# Patient Record
Sex: Female | Born: 1985 | Race: White | Hispanic: No | Marital: Married | Smoking: Current every day smoker
Health system: Southern US, Community
[De-identification: ages and names within clinical notes are randomized; demographics above are authoritative.]

## PROBLEM LIST (undated history)

## (undated) DIAGNOSIS — S53106A Unspecified dislocation of unspecified ulnohumeral joint, initial encounter: Secondary | ICD-10-CM

---
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
## 2005-05-20 ENCOUNTER — Emergency Department (HOSPITAL_COMMUNITY): Admission: EM | Admit: 2005-05-20 | Discharge: 2005-05-20 | Payer: Self-pay | Admitting: Emergency Medicine

## 2005-10-15 ENCOUNTER — Emergency Department (HOSPITAL_COMMUNITY): Admission: EM | Admit: 2005-10-15 | Discharge: 2005-10-15 | Payer: Self-pay | Admitting: Family Medicine

## 2006-02-02 ENCOUNTER — Inpatient Hospital Stay (HOSPITAL_COMMUNITY): Admission: EM | Admit: 2006-02-02 | Discharge: 2006-02-06 | Payer: Self-pay | Admitting: Emergency Medicine

## 2006-02-02 ENCOUNTER — Ambulatory Visit: Payer: Self-pay | Admitting: Internal Medicine

## 2008-07-08 ENCOUNTER — Emergency Department (HOSPITAL_BASED_OUTPATIENT_CLINIC_OR_DEPARTMENT_OTHER): Admission: EM | Admit: 2008-07-08 | Discharge: 2008-07-09 | Payer: Self-pay | Admitting: Emergency Medicine

## 2008-07-09 ENCOUNTER — Ambulatory Visit: Payer: Self-pay | Admitting: Diagnostic Radiology

## 2008-07-22 ENCOUNTER — Emergency Department (HOSPITAL_BASED_OUTPATIENT_CLINIC_OR_DEPARTMENT_OTHER): Admission: EM | Admit: 2008-07-22 | Discharge: 2008-07-22 | Payer: Self-pay | Admitting: Emergency Medicine

## 2009-04-30 ENCOUNTER — Emergency Department (HOSPITAL_COMMUNITY): Admission: EM | Admit: 2009-04-30 | Discharge: 2009-04-30 | Payer: Self-pay | Admitting: Emergency Medicine

## 2009-08-04 ENCOUNTER — Emergency Department (HOSPITAL_COMMUNITY): Admission: EM | Admit: 2009-08-04 | Discharge: 2009-08-04 | Payer: Self-pay | Admitting: Emergency Medicine

## 2010-03-26 ENCOUNTER — Encounter: Payer: Self-pay | Admitting: Orthopedic Surgery

## 2010-07-21 NOTE — Procedures (Signed)
EEG NUMBER:  [id number]   This 25 year old female was found unresponsive.  She was brought to the  emergency room by EMS and she had taken several Valiums.  The patient  was started on Rocephin.   TECHNICAL DESCRIPTION:  This EEG was recorded in an obtunded patient.  The background activity shows well-formed alpha but also shows frontal  intermittent delta activity which is more prominent in the left than the  right.  No definite epileptiform activity is seen with this.  Stage II  sleep was also noted during this EEG.   Photic stimulation did not produce a drive response.  Hyperventilation  testing produced no significant abnormalities.   IMPRESSION:  Markedly abnormal EEG showing frontal intermittent delta  slowing, more prominent in the left than the right.  No definite  epileptiform activity is seen on this study.  Findings above can be seen  with multiple etiologies and by themselves are nonspecific, toxic  metabolic disorders, postictal state, medication effect, etc. should be  considered in the differential diagnosis.           ______________________________  Genene Churn. Sandria Manly, M.D.     YNW:GNFA  D:  02/04/2006 14:21:40  T:  02/05/2006 10:02:30  Job #:  [id number]   cc:   Madaline Guthrie, M.D.  Fax: [phone]

## 2010-07-21 NOTE — Discharge Summary (Signed)
Marie Mckay, Marie Mckay              ACCOUNT NO.:  [id number]   MEDICAL RECORD NO.:  [id number]          PATIENT TYPE:  INP   LOCATION:  4731                         FACILITY:  [facility]   PHYSICIAN:  Peggye Pitt, M.D. DATE OF BIRTH:  1985/03/26   DATE OF ADMISSION:  02/02/2006  DATE OF DISCHARGE:  02/06/2006                               DISCHARGE SUMMARY   DISCHARGE DIAGNOSES:  1. Ventilatory-dependent respiratory failure.  2. Overdose with opiates and barbiturates and benzodiazepine,      resolved.   DISCHARGE MEDICATIONS:  1. Cymbalta 20 mg p.o. b.i.d.  2. Trazodone 25 mg p.o. q.h.s.   DISPOSITION AND FOLLOWUP:  The patient is to follow up with her primary  care physician, Dr. Lenise Arena at [facility].  The date of the  appointment has been set for Tuesday, December 11th at 10 a.m.  The case  manager is also to arrange followup with outpatient psychiatry.  The  patient will be called with the date and time with this appointment.   IMAGING STUDIES PERFORMED DURING THIS ADMISSION:  The patient had a CT  of the head without contrast that showed no acute intracranial  abnormality with some sphenoid sinusitis with mucosal thickening of the  ethmoid air cells.  The patient had a portable chest x-ray that showed a  right upper lobe and right perihilar opacity, most consistent with  aspiration pneumonia.  The patient also had an MRI of the brain with and  without contrast that was within normal limits, showed no acute stroke,  abnormal enhancement, or inflammatory process, and again saw the acute  sphenoid sinusitis.   BRIEF HISTORY AND PHYSICAL EXAMINATION:  For full details, please see  the chart, but in brief, Marie Mckay is a 25 year old Caucasian female  who was brought in by EMS.  Our initial report was that she had had some  Valium tablets the night prior to admission, and the morning of  admission she fell off her couch and began seizing.  A friend called the  EMS who gave Valium x1 which stopped the seizures.  She was intubated  upon arrival to the emergency room secondary to an inability to maintain  her airway and pneumonia, possibly causing aspiration.  The patient was  unable to give any history upon admission.   On admission:  VITAL SIGNS:  Her temperature was 99.3, her blood pressure was 132/76  dropping to 75/35 immediately after intubation.  Her heart rate was 130.  She was sating 99% and 100% Fi02.   LABORATORY UPON ADMISSION:  Her sodium was 135, potassium 4.6, chloride  102, bicarb 27, BUN 13, creatinine 1.0 with a glucose of 139.  Her  bilirubin was 0.7, alkaline phosphatase 80, AST 104, ALT 73, protein  6.9, albumin 3.5.  Her calcium was 8.3.  Her white blood cell count was  22.8 with an ANC of 21.2.  Her hemoglobin was 14.6 with an MCV of 96,  and her platelets were at 329.  UA was only positive for glucose of 100.  Her urine pregnancy test was negative.  Her alcohol level was  less than  5.  Tylenol and salicylate levels were within normal limits.  Her UDS  was positive for marijuana, barbiturates, and benzodiazepine.  Her  initial ABG and 100% FiO2 delivered via ventilator showed a pH of 7.27,  a CO2 of 56, an O2 of 299, and a bicarb of 26.   HOSPITAL COURSE:  Altered mental status and seizures secondary to  overdose.  The patient needed to be intubated upon arrival secondary to  inability to maintain her airway.  During intubation, she had  significant amount of vomiting, which possibly precipitated an episode  of aspiration pneumonitis.  Immediately after intubation, her blood  pressure dropped, which initiated a workup for sepsis; however, this was  finally believed to be secondary to the medications that she received  for intubation.  She was placed on dopamine drip for a brief time, but  her blood pressure responded nicely to fluid boluses.  Post admission,  the patient passed a spontaneous breathing trial, which enabled  [place] to  extubate her.  She did quite well throughout the rest of her admission.  Her initial white count then dropped.  This was thought to be secondary  to the seizures.  CT and MRI of the head were all within normal limits.  Dr. Jeanie Sewer with psychiatry has seen the patient and has recommended  outpatient psychiatry followup as well as initiation of Cymbalta and  Trazodone.  The patient has been extensively counseled.  She states that  she did not intend to kill herself and that this was indeed a mistake of  her taking some of her friend's opiates mixed with alcohol.   The patient is discharged in stable condition to home.  She will follow  up with her primary care physician as well as with psychiatry.   Upon discharge, vital signs were temperature 97.4, heart rate 61,  respiratory rate 20, blood pressure 107/67.  O2 sats are 95% on room  air.   LABORATORY UPON DISCHARGE:  Show a hemoglobin of 10.5, sodium 142,  potassium 3.5, chloride 108, bicarb 23, BUN 3, creatinine 0.6, a glucose  of 83, and a calcium of 8.6.      Peggye Pitt, M.D.  Electronically Signed     EH/MEDQ  D:  02/06/2006  T:  02/06/2006  Job:  [id number]   cc:   Marie Mckay, M.D.

## 2010-07-21 NOTE — Consult Note (Signed)
Marie Mckay, Marie Mckay              ACCOUNT NO.:  [id number]   MEDICAL RECORD NO.:  [id number]            PATIENT TYPE:   LOCATION:                                 FACILITY:   PHYSICIAN:  Antonietta Breach, M.D.       DATE OF BIRTH:   DATE OF CONSULTATION:  02/05/2006  DATE OF DISCHARGE:                                 CONSULTATION   REASON FOR CONSULTATION:  Overdose, substance abuse, depression.   HISTORY OF PRESENT ILLNESS:  [name] is a 25 year old female  admitted to the [facility] health system on the 1st of December 2007  after an overdose with Valium, narcotics, and wine.   The patient openly states that she took a narcotic for pain, drank a  glass of wine, and took some friend's Valium. She had not been sleeping  and just wanted relief. She repeatedly states that she had no intention  of suicide. She describes in open-ended discussions with the undersigned  and staff interest in the future, constructive goals, and no suicidal  ideation. She has no thoughts of harming others, no delusions, no  hallucinations.   She does acknowledge a number of stresses including a breakup with a  boyfriend two weeks ago and having to find a new place to stay due to  her grandmother moving to a condominium. However, she also mentions a  number of friends that she continues to socialize with and have fun  with. Her orientation is intact. Her memory is intact.   PAST PSYCHIATRIC HISTORY:  The patient has suffered great loss. Both her  parents passed away by the time she was age 41, and her sister passed  away in a car wreck with her father. She developed depression eventually  and was placed on Zoloft by her outpatient psychiatrist. This took place  in her late teens.   The symptoms of depressed mood, decreased energy, poor concentration,  anhedonia, and insomnia resolved after several weeks of experiencing  them. She also was placed on Xanax for feeling on edge, muscle tension,  worry, and insomnia.   The patient also underwent psychotherapy to help her get through the  grief and loss. Eventually she came off of the Zoloft. However, she  describes several periods of depression. She has no history of mania,  delusions, hallucinations, psychiatric admissions or suicide attempts.   FAMILY PSYCHIATRIC HISTORY:  Unknown.   SOCIAL HISTORY:  The patient is single. She has never been married. She  has no children. She went to live with her grandmother after her parents  passed away. She has an education through the eleventh grade. She pulled  out of high school and went to work. [place]. She  states she does marijuana fairly often. She denies any other illegal  drugs. She denies drinking alcohol regularly and points out that she  just turned 20. She is not having any alcohol or benzodiazepine or  barbiturate withdrawal symptoms.   GENERAL MEDICAL PROBLEMS:  The patient  reported to the emergency room on  the 13th of August 2007. Had a knee injury at that time that led to  subsequent prescription of a narcotic. She is status post intubation  after the overdose of hydrocodone, Valium, and wine.   MEDICATIONS:  MAR is reviewed. The patient is not currently on any  psychotropics.   ALLERGIES:  She has no known drug allergies.   LABORATORY DATA:  White blood cell count 10.4, hemoglobin 10.5, platelet  count 245. INR 1.1. BUN 3, creatinine 0.6. SGOT 25, SGPT 43. Albumin  2.4. Calcium 8.6. Urine HCG negative. Tylenol level negative. Aspirin  negative. Urine drug screen positive for barbiturates, positive for  benzodiazepines, positive for THC. Alcohol negative.   On the 2nd of December the patient underwent an MRI of the brain with  and without contrast. This image was within normal limits without any  acute process. There was a node of acute sphenoid sinusitis.   REVIEW OF  SYSTEMS:  CONSTITUTIONAL:  Afebrile. HEENT:  No trauma. Eyes:  No visual changes. Ears:  No hearing impairment. Nose:  No rhinorrhea.  Mouth/throat:  No sore throat. NEUROLOGIC:  Unremarkable. PSYCHIATRIC:  As above. The patient does describe decreased energy, insomnia, some  decreased concentration as well as irritability over a number of weeks.  The patient was also treated with Cymbalta after the Zoloft and Cymbalta  were most effective and successful in relieving her depressive symptoms.  She is very much interested in restarting that. Also the insomnia was  treated successfully with trazodone. CARDIOVASCULAR:  No chest pain,  palpitations or edema. RESPIRATORY:  No coughing or wheezing.  GASTROINTESTINAL:  No nausea, vomiting, diarrhea. GENITOURINARY:  No  dysuria. SKIN:  Unremarkable. ENDOCRINE/METABOLIC:  Unremarkable.  HEMATOLOGIC/LYMPHATIC:  Slight anemia.  MUSCULOSKELETAL:  The patient bruised her knee back in August.   PHYSICAL EXAMINATION:  VITAL SIGNS:  Temperature 97.5, pulse 75,  respirations 18, blood pressure 105/76, O2 saturation on room air 97%.   MENTAL STATUS EXAM:  [name] is an alert female sitting up in her  hospital bed, well-groomed, and appearing her stated age. The patient is  oriented to all spheres. Her memory is intact to immediate recent remote  except for the blackout overdose. Her speech involves normal rate and  prosody. Her fund of knowledge and intelligence are within normal  limits. Her concentration is within normal limits. Her affect is mildly  constricted at baseline with a broad appropriate response. Her mood is  mildly anxious. Thought process is logical, coherent, goal-directed, no  looseness of association or thought content. No thoughts of harming  herself. No thoughts of harming others. No delusions, no hallucinations.  Her insight is partial in that she recognizes her depression and anxiety symptoms. However, she does not recognize her  substance abuse pattern.  Her judgment is intact for normal social function and self-care.   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 296.32, major  depressive disorder. Rule out polysubstance dependence.   AXIS II:  Deferred.   AXIS III:  See general medical problems.   AXIS IV:  Primary support group.   AXIS V:  1.   Ms. Keefe is not at risk to harm herself or others. She agrees to use  emergency services immediately for any thoughts of harming herself,  thoughts of harming others or distress.   Given the patient's combination of moderate depression, anxiety, and  abuse of substances, the undersigned recommended inpatient psychiatric  dual diagnoses treatment. However, the patient declined and she is not  committable. She does agree to outpatient psychiatric treatment, but she  also declines any substance abuse relapse prevention programs.   Informed consent, the indications, alternatives, and adverse effects of  Cymbalta and trazodone were discussed with the patient. She understands  the above information and would like to restart Cymbalta for  antidepression and trazodone for anti insomnia and augmenting Cymbalta.  She understands potential complications of making the doses of these two  agents too high. The Cymbalta having serotin reuptake inhibition as part  of its profile can help with anxiety as can trazodone.   RECOMMENDATIONS:  1. Would restart Cymbalta at the low starting dose of 20 mg p.o.      b.i.d. at 8 a.m. and 4 p.m.  2. Would start trazodone at 25 mg q.h.s. with 25 mg p.r.n., then would      adjust trazodone based upon p.r.n. needed the night before by 25 to      50 mg per day to an estimated effective dose of 100 mg q.h.s. The      patient will not drive if drowsy.  3. Recommendation for followup. The case manager can call one of the      psychiatric clinics attached to [facility], [facility] or        [facility] to set this patient up with pharmacotherapy      followup and psychotherapy. The psychotherapy could involve      cognitive behavioral therapy, deep breathing, and progressive      muscle relaxation. Also as the therapeutic alliance develops and      insight into any remaining substance abuse problems develops, the      patient's engagement in chemical abuse prevention could be      facilitated.  4. Ego supportive psychotherapy and education were provided to      patient.      Antonietta Breach, M.D.  Electronically Signed     JW/MEDQ  D:  02/06/2006  T:  02/06/2006  Job:  [id number]   cc:   Madaline Guthrie, M.D.

## 2013-06-18 ENCOUNTER — Encounter (HOSPITAL_COMMUNITY): Payer: Self-pay | Admitting: Emergency Medicine

## 2013-06-18 ENCOUNTER — Emergency Department (HOSPITAL_COMMUNITY)
Admission: EM | Admit: 2013-06-18 | Discharge: 2013-06-18 | Disposition: A | Discharge status: Home / Self Care | Payer: Self-pay | Attending: Emergency Medicine | Admitting: Emergency Medicine

## 2013-06-18 ENCOUNTER — Emergency Department (HOSPITAL_COMMUNITY): Payer: Self-pay

## 2013-06-18 DIAGNOSIS — Y93E1 Activity, personal bathing and showering: Secondary | ICD-10-CM | POA: Insufficient documentation

## 2013-06-18 DIAGNOSIS — F17200 Nicotine dependence, unspecified, uncomplicated: Secondary | ICD-10-CM | POA: Insufficient documentation

## 2013-06-18 DIAGNOSIS — R000 Tachycardia, unspecified: Secondary | ICD-10-CM | POA: Insufficient documentation

## 2013-06-18 DIAGNOSIS — Y929 Unspecified place or not applicable: Secondary | ICD-10-CM | POA: Insufficient documentation

## 2013-06-18 DIAGNOSIS — S32010A Wedge compression fracture of first lumbar vertebra, initial encounter for closed fracture: Secondary | ICD-10-CM

## 2013-06-18 DIAGNOSIS — W1809XA Striking against other object with subsequent fall, initial encounter: Secondary | ICD-10-CM | POA: Insufficient documentation

## 2013-06-18 DIAGNOSIS — S32009A Unspecified fracture of unspecified lumbar vertebra, initial encounter for closed fracture: Secondary | ICD-10-CM | POA: Insufficient documentation

## 2013-06-18 MED ORDER — METHOCARBAMOL 500 MG PO TABS: 500.0000 mg | ORAL_TABLET | Freq: Two times a day (BID) | ORAL | Status: DC

## 2013-06-18 MED ORDER — DIAZEPAM 5 MG PO TABS
10.0000 mg | ORAL_TABLET | Freq: Once | ORAL | Status: AC
  Administered 2013-06-18: 10 mg via ORAL
  Filled 2013-06-18: qty 2

## 2013-06-18 MED ORDER — IBUPROFEN 800 MG PO TABS
800.0000 mg | ORAL_TABLET | Freq: Once | ORAL | Status: AC
  Administered 2013-06-18: 800 mg via ORAL
  Filled 2013-06-18: qty 1

## 2013-06-18 MED ORDER — OXYCODONE-ACETAMINOPHEN 5-325 MG PO TABS: 1.0000 | ORAL_TABLET | Freq: Four times a day (QID) | ORAL | Status: DC | PRN

## 2013-06-18 MED ORDER — OXYCODONE-ACETAMINOPHEN 5-325 MG PO TABS
2.0000 | ORAL_TABLET | Freq: Once | ORAL | Status: AC
  Administered 2013-06-18: 2 via ORAL
  Filled 2013-06-18: qty 2

## 2013-06-18 NOTE — Discharge Instructions (Signed)
1. Medications: robaxin, percocet, ibuprofen 800mg  three times per day with food, usual home medications 2. Treatment: rest, drink plenty of fluids, gentle stretching as discussed, alternate ice and heat 15 min on and 15 min off 3. Follow Up: [facility] of your diagnoses and further evaluation after today's visit within 1 week;  if you do not have a primary care doctor use the resource guide provided to find one;    Back, Compression Fracture A compression fracture happens when a force is put upon the length of your spine. Slipping and falling on your bottom are examples of such a force. When this happens, sometimes the force is great enough to compress the building blocks (vertebral bodies) of your spine. Although this causes a lot of pain, this can usually be treated at home, unless your caregiver feels hospitalization is needed for pain control. Your backbone (spinal column) is made up of 24 main vertebral bodies in addition to the sacrum and coccyx (see illustration). These are held together by tough fibrous tissues (ligaments) and by support of your muscles. Nerve roots pass through the openings between the vertebrae. A sudden wrenching move, injury, or a fall may cause a compression fracture of one of the vertebral bodies. This may result in back pain or spread of pain into the belly (abdomen), the buttocks, and down the leg into the foot. Pain may also be created by muscle spasm alone. Large studies have been undertaken to determine the best possible course of action to help your back following injury and also to prevent future problems. The recommendations are as follows. FOLLOWING A COMPRESSION FRACTURE: Do the following only if advised by your caregiver.   If a back brace has been suggested or provided, wear it as directed.  DO NOT stop wearing the back brace unless instructed by your caregiver.  When allowed to return to regular  activities, avoid a sedentary life style. Actively exercise. Sporadic weekend binges of tennis, racquetball, water skiing, may actually aggravate or create problems, especially if you are not in condition for that activity.  Avoid sports requiring sudden body movements until you are in condition for them. Swimming and walking are safer activities.  Maintain good posture.  Avoid obesity.  If not already done, you should have a DEXA scan. Based on the results, be treated for osteoporosis. FOLLOWING ACUTE (SUDDEN) INJURY:  Only take over-the-counter or prescription medicines for pain, discomfort, or fever as directed by your caregiver.  Use bed rest for only the most extreme acute episode. Prolonged bed rest may aggravate your condition. Ice used for acute conditions is effective. Use a large plastic bag filled with ice. Wrap it in a towel. This also provides excellent pain relief. This may be continuous. Or use it for 30 minutes every 2 hours during acute phase, then as needed. Heat for 30 minutes prior to activities is helpful.  As soon as the acute phase (the time when your back is too painful for you to do normal activities) is over, it is important to resume normal activities and work Arboriculturist. Back injuries can cause potentially marked changes in lifestyle. So it is important to attack these problems aggressively.  See your caregiver for continued problems. He or she can help or refer you for appropriate exercises, physical therapy and work hardening if needed.  If you are given narcotic medications for your condition, for the next 24 hours DO NOT:  Drive  Operate machinery  or power tools.  Sign legal documents.  DO NOT drink alcohol, take sleeping pills or other medications that may interfere with treatment. If your caregiver has given you a follow-up appointment, it is very important to keep that appointment. Not keeping the appointment could result in a chronic or  permanent injury, pain, and disability. If there is any problem keeping the appointment, you must call back to this facility for assistance.  SEEK IMMEDIATE MEDICAL CARE IF:  You develop numbness, tingling, weakness, or problems with the use of your arms or legs.  You develop severe back pain not relieved with medications.  You have changes in bowel or bladder control.  You have increasing pain in any areas of the body. Document Released: 02/19/2005 Document Revised: 05/14/2011 Document Reviewed: 09/24/2007 [facility] Patient Information 2014 [place], [place].    Emergency Department Resource Guide 1) Find a Doctor and Pay Out of Pocket Although you won't have to find out who is covered by your insurance plan, it is a good idea to ask around and get recommendations. You will then need to call the office and see if the doctor you have chosen will accept you as a new patient and what types of options they offer for patients who are self-pay. Some doctors offer discounts or will set up payment plans for their patients who do not have insurance, but you will need to ask so you aren't surprised when you get to your appointment.  2) Contact Your Local Health Department Not all health departments have doctors that can see patients for sick visits, but many do, so it is worth a call to see if yours does. If you don't know where your local health department is, you can check in your phone book. The CDC also has a tool to help you locate your state's health department, and many state websites also have listings of all of their local health departments.  3) Find a Walk-in Clinic If your illness is not likely to be very severe or complicated, you may want to try a walk in clinic. These are popping up all over the country in pharmacies, drugstores, and shopping centers. They're usually staffed by nurse practitioners or physician assistants that have been trained to treat common illnesses and complaints.  They're usually fairly quick and inexpensive. However, if you have serious medical issues or chronic medical problems, these are probably not your best option.  No Primary Care Doctor: - Call Health Connect at  [phone] - they can help you locate a primary care doctor that  accepts your insurance, provides certain services, etc. - Physician Referral Service- [phone]  Chronic Pain Problems: Organization         Address  Phone   Notes  [facility]  [phone] Patients need to be referred by their primary care doctor.   Medication Assistance: Organization         Address  Phone   Notes  [facility] Medication [facility] [address] [place], [place] [address] [phone] --Must be a resident of [place] [facility] [place] -- Must have NO insurance coverage whatsoever (no Medicaid/ Medicare, etc.) -- The pt. MUST have a primary care doctor that directs their care regularly and follows them in the community   MedAssist  [phone]   [employer]  [phone]    Agencies that provide inexpensive medical care: Organization         Address  Phone   Notes  [facility]  [phone]   [facility]    [phone]   [facility] [place], [place] [address] [phone]   [place] [address], [place] [phone]   Planned Parenthood    [phone]   [facility]    [phone]   [place] and [place] [address], [place] Phone:  [phone], Fax:  [phone] Hours of Operation:  9 am - 6 pm, M-F.  Also accepts Medicaid/Medicare and self-pay.  [facility] for [place], [address], [place] Phone: [phone], Fax: [phone]. Hours of Operation:  8:30 am - 5:30 pm, M-F.  Also accepts Medicaid and self-pay.  [facility] [place] [address], [place]  Phone: [phone]   [place], [place], [place] [phone], Ext. [phone] Mondays & Thursdays: 7-9 AM.  First 15 patients are seen on a first come, first serve basis.    [place] Providers:  Organization         Address  Phone   Notes  [facility] [address], [place] [phone] Also accepts self-pay patients.  [facility] [id number] [place], [place]  [phone]   [place], [address], [place] [phone]   [facility] [address] [place] [phone]   Lucianne Lei [address], [place]   [phone] Only accepts [place] Access [place] patients after they have their name applied to their card.   Self-Pay (no insurance) in [facility]:  Organization         Address  Phone   Notes  Sickle Cell Patients, [facility] Internal Medicine [place] [phone]   [facility] [place] [phone]   [facility] [place]  [place], [place], [place] [phone]   [facility]/Dr. Osei-Bonsu  [address] [place] or [address], [place] [phone] Phone number for both [place] and [place] locations is the same.  [facility] [address] [place] [phone]   [facility] [address] [place] or [address] [phone]   [facility] [address], [place] [phone], phone; [phone], fax Sees patients 1st and 3rd Saturday of every month.  Must not qualify for public or private insurance (i.e. Medicaid, Medicare, [place] Health Choice, Veterans' Benefits)  Household income should be no more than 200% of the poverty level The clinic cannot  treat you if you are pregnant or think you are pregnant  Sexually transmitted diseases are not treated at the clinic.    Dental Care: Organization         Address  Phone  Notes  [facility] Department of [facility] [place] [phone] Accepts children up to age 59 who are enrolled in [place] or [place]; pregnant women with a Medicaid card; and children who have applied for Medicaid or [place] Health Choice, but were declined, whose parents can pay a reduced fee at time of service.  [facility] Department of  [facility]  [address], [place] [phone] Accepts children up to age 19 who are enrolled in Medicaid or [place]; pregnant women with a Medicaid card; and children who have applied for Medicaid or [place] Health Choice, but were declined, whose parents can pay a reduced fee at time of service.  [place] Adult Dental Access PROGRAM  [place] [phone] Patients are seen by appointment only. Walk-ins are not accepted. [place] will see patients 4 years of age and older. Monday - Tuesday (8am-5pm) Most Wednesdays (8:30-5pm) $30 per visit, cash only  [facility] Adult Dental Access PROGRAM  [address], [facility] [phone] Patients are seen by appointment only. Walk-ins are not accepted. [place] will see patients 40 years of age and older. One Wednesday Evening (Monthly: Volunteer Based).  $30 per visit, cash only  [place]  [phone] for adults; Children under age 72, call Graduate Pediatric Dentistry at [phone]. Children aged 70-14, please call [phone] to request a pediatric application.  Dental services are provided in all areas of dental care including fillings, crowns and bridges, complete and partial dentures, implants, gum treatment, root canals, and extractions. Preventive care is also provided.  Treatment is provided to both adults and children. Patients are selected via a lottery and there is often a waiting list.   [facility] [address], [place]  [phone] [email or web address]   [facility] [address] [place], [place] [phone], Ext. [phone] Second and Fourth Thursday of each month, opens at 6:30 AM; Clinic ends at 9 AM.  Patients are seen on a first-come first-served basis, and a limited number are seen during each clinic.   [facility]  [address] Hillard Danker [place], [place] [phone]   Eligibility Requirements You must have lived in [place], [place], or [place] counties for at least the last three months.   You cannot be eligible for state or federal sponsored [employer], including [employer], [place], or [employer].   You generally cannot be eligible for healthcare insurance through your employer.    How to apply: Eligibility screenings are held every Tuesday and Wednesday afternoon from 1:00 pm until 4:00 pm. You do not need an appointment for the interview!  [facility] [address], [place], [place]   [place]  [place] Department  [place]  [phone]    Behavioral Health Resources in the Community: Intensive Outpatient Programs Organization         Address  Phone  Notes  [place]. [address] [place], [place] [phone]   [facility] [address] [place], [place]   ADS: Alcohol & Drug Svcs [address] [place], [place]   [place] [address],  [place], [place] or [phone]   Substance Abuse Resources Organization         Address  Phone  Notes  Alcohol and Drug Services  [phone]   [place]   [phone]   [place]   Chinita Pester  [phone]   Residential & Outpatient Substance Abuse Program  [phone]   Psychological Services Organization         Address  Phone  Notes  [place]  [phone]- [phone]   [facility]  [phone]- [phone]   [facility] [address], [place] [phone] or [phone]    Mobile Crisis Teams Organization         Address  Phone  Notes  [facility], Mobile Crisis Care Unit  [phone]   [facility]  [address] [place], [place] [phone]   Doristine LocksSharon DeEsch [address] [place] [phone]    Self-Help/Support Groups Organization         Address  Phone             Notes  [facility] of [place] - variety of support groups  336- [phone] Call for more information  Narcotics Anonymous (NA), [facility] [address], [place]  2 meetings at this location   Statisticianesidential Treatment Programs Organization         Address  Phone  Notes  [facility] [address],    [place]  [phone]   [facility]  [address], [place] [phone], [place], [place] [phone]   [facility] [address], [place] [phone] Admissions: 8am-3pm M-F  [facility] [address]    [place], [place] [phone]   [facility] [address], [place], [place] [phone]   [facility] [address]  [place], [place] [phone]   [facility] - Intensive Outpatient [address], [place], [place] [phone]   [facility] ([facility] [address]  [place], [place] [phone] or [phone]   [facility] ([facility]) [address] [place], [place] [phone] Accepts Medicaid  [facility] [address]  [place] [phone] Substance  Abuse/Addiction Treatment   [facility] Behavioral Health Resources Organization         Address  Phone  Notes  [facility]  [phone]) [phone]   Angie FavaJulie Brannon, PhD [address], [address] [place], [place]   [phone] or [phone]   [facility]   [address] [place], [place] [phone](336) [phone]   [facility] [address], [place], [place] [phone]) [phone] Insurance/Medicaid/sponsorship through [facility]  [facility] Families [address]                                    [place], [place] [phone] Therapy/tele-psych/case  [facility] [address]   [place], [place] [phone]) [phone]    Dr. Lolly MustacheArfeen  [phone]   [facility] [place]  [employer] [facility] 1) [address], [place] 2) [address], [place] 3)  [address], [place] [phone](336) [phone]  [phone]) [phone]   [facility] Child Abuse Hotline [phone]) [phone] or [phone]) [phone] (After Hours)

## 2013-06-18 NOTE — ED Notes (Signed)
PA at bedside.

## 2013-06-18 NOTE — ED Notes (Signed)
Pt reports falling four days ago in the shower. Pt reports that she hit her back, neck, and head on the tub. Pt reports attempting to utilize a heating pad, which has not relieved the pain. Pt is tachycardiac at a rate of 132 in triage. Pt is A/O x4.

## 2013-06-18 NOTE — ED Notes (Signed)
Pt states she has a ride home. 

## 2013-06-18 NOTE — ED Provider Notes (Signed)
CSN: [id number]     Arrival date & time 06/18/13  1805 History   First MD Initiated Contact with Patient 06/18/13 1959     Chief Complaint  Patient presents with  . Fall     (Consider location/radiation/quality/duration/timing/severity/associated sxs/prior Treatment) The history is provided by the patient and medical records. No language interpreter was used.    Marie Mckay is a 28 y.o. female  with no medical problems presents to the Emergency Department complaining of gradual, persistent, progressively worsening low back pain onset 5 days ago after falling in the bathtub and hitting her low back on a bottle of conditioner. Pt reports she slipped in the wet shower and did not have an LOC or hit her head.  No hx of back problems or back surgery.  Pt has used a heating pad without much relief and has not tried any OTC medications.  Bending makes the pain much worse and rest makes it some better.  Pt denies fever, chills, headache, neck pain, chest pain, SOB, abd pain, N/V/D, weakness, numbness, tingling, gait disturbance, saddle anesthesia, loss of bowel or bladder control.  Pt denies anticoagulation, IVDU, hx of cancer.     History reviewed. No pertinent past medical history. History reviewed. No pertinent past surgical history. No family history on file. History  Substance Use Topics  . Smoking status: Current Every Day Smoker -- 1.00 packs/day for 10 years    Types: Cigarettes  . Smokeless tobacco: Never Used  . Alcohol Use: Yes     Comment: Socially    OB History   Grav Para Term Preterm Abortions TAB SAB Ect Mult Living                 Review of Systems  Constitutional: Negative for fever and fatigue.  Respiratory: Negative for chest tightness and shortness of breath.   Cardiovascular: Negative for chest pain.  Gastrointestinal: Negative for nausea, vomiting, abdominal pain and diarrhea.  Genitourinary: Negative for dysuria, urgency, frequency and hematuria.    Musculoskeletal: Positive for back pain. Negative for gait problem, joint swelling, neck pain and neck stiffness.  Skin: Negative for rash.  Neurological: Negative for weakness, light-headedness, numbness and headaches.  All other systems reviewed and are negative.     Allergies  Review of patient's allergies indicates no known allergies.  Home Medications   Prior to Admission medications   Not on File   BP 137/99  Pulse 121  Temp(Src) 98.8 F (37.1 C) (Oral)  Resp 19  SpO2 97%  LMP 05/26/2013 Physical Exam  Nursing note and vitals reviewed. Constitutional: She is oriented to person, place, and time. She appears well-developed and well-nourished. No distress.  HENT:  Head: Normocephalic and atraumatic.  Mouth/Throat: Oropharynx is clear and moist. No oropharyngeal exudate.  Eyes: Conjunctivae are normal.  Neck: Normal range of motion and full passive range of motion without pain. Neck supple. No spinous process tenderness and no muscular tenderness present. No rigidity. Normal range of motion present.  Full ROM without pain No midline or paraspinal tenderness  Cardiovascular: Regular rhythm, normal heart sounds and intact distal pulses.   No murmur heard. tachycardia  Pulmonary/Chest: Effort normal and breath sounds normal. No respiratory distress. She has no wheezes.  Abdominal: Soft. Bowel sounds are normal. She exhibits no distension. There is no tenderness.  Soft and nontender  Musculoskeletal:       Thoracic back: She exhibits pain.       Lumbar back: She exhibits tenderness and  bony tenderness.       Back:  Full range of motion of the T-spine and L-spine Tenderness to palpation of the spinous processes of the upper L-spine Mild tenderness to palpation of the bilateral paraspinous muscles of the L-spine  Lymphadenopathy:    She has no cervical adenopathy.  Neurological: She is alert and oriented to person, place, and time. She has normal reflexes. She exhibits  normal muscle tone. Coordination normal.  Speech is clear and goal oriented, follows commands Normal strength in upper and lower extremities bilaterally including dorsiflexion and plantar flexion, strong and equal grip strength Sensation normal to light and sharp touch Moves extremities without ataxia, coordination intact Normal gait Normal balance  Skin: Skin is warm and dry. No rash noted. She is not diaphoretic. No erythema.  Psychiatric: She has a normal mood and affect. Her behavior is normal.    ED Course  Procedures (including critical care time) Labs Review Labs Reviewed - No data to display  Imaging Review Dg Thoracic Spine 2 View  06/18/2013   CLINICAL DATA:  Fall with back pain.  EXAM: THORACIC SPINE - 2 VIEW  COMPARISON:  None.  FINDINGS: There appears to be a an acute mild compression of the L1 vertebral body. This is actually better characterized on the lumbar spine study which is dictated separately. No visible retropulsion on the thoracic spine study. No other fracture or evidence of subluxation.  IMPRESSION: Mild acute compression fracture of the L1 vertebral body. No visible retropulsion. The fracture is better visualized on the lumbar spine study.   Electronically Signed   By: Irish LackGlenn  Yamagata M.D.   On: 06/18/2013 20:56   Dg Lumbar Spine Complete  06/18/2013   CLINICAL DATA:  Fall with back pain.  EXAM: LUMBAR SPINE - COMPLETE 4+ VIEW  COMPARISON:  None.  FINDINGS: There are 6 non rib-bearing vertebral bodies with the S1 level showing lumbarization. Using this numbering system, the L1 vertebral body demonstrates a mild acute compression fracture involving the superior endplate and anterior vertebral body with approximately 15-20% loss of height. There is no retropulsion or involvement of posterior elements by x-ray.  There does appear to be a likely bilateral pars defects at the S1 level with a minimal anterolisthesis of S1 on S2 of just a few mm. On oblique views, this does  not appear to be an acute injury at the S1 level.  IMPRESSION: Mild acute compression fracture of the L1 vertebral body with approximately 15-20% loss of anterior vertebral body height. There is no evidence of retropulsion. 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   Electronically Signed   By: Irish LackGlenn  Yamagata M.D.   On: 06/18/2013 21:04     EKG Interpretation None      MDM   Final diagnoses:  Compression fracture of L1 lumbar vertebra    Mashawn L Siddique presents with low back pain several days after falling and midline upper L-spine tenderness on exam.  NO neurologic deficits and ambulates with pain, but without gait disturbance.  Will obtain x-rays.    10:17 PM X-ray with Mild acute compression fracture of the L1 vertebral body with approximately 15-20% loss of anterior vertebral body height. There is no evidence of retropulsion.  I personally reviewed the imaging tests through PACS system  I reviewed available ER/hospitalization records through the EMR  Pt pain controlled at this time.  She remains neurologically intact.  Discussed with Dr. Radford PaxBeaton who recomnmeds d/c with ortho follow-up as needed in 1 week.  Pt reports no insurance and inability to follow-up with ortho.  Recommend f/u with [facility] in 1 week for further evaluation.    The patient/guardian have been advised of the diagnosis and plan. We have discussed signs and symptoms that warrant return to the ED, such as changes or worsening in symptoms including saddle anesthesia, loss of bowel or bladder control or gait disturbance.   Pt with tachycardia on arrival, improved on my repeat physical exam to low 100s after pain control.  Noted that pt's HR was 121 at d/c, but I was not made aware of this.  Pt was ambulated again prior to d/c and had increased pain without neurologic deficits which may have been the cause.         Dahlia ClientHannah Samyra Limb, PA-C 06/19/13 0121

## 2013-06-23 ENCOUNTER — Emergency Department (INDEPENDENT_AMBULATORY_CARE_PROVIDER_SITE_OTHER): Payer: Self-pay

## 2013-06-23 ENCOUNTER — Encounter (HOSPITAL_COMMUNITY): Payer: Self-pay | Admitting: Emergency Medicine

## 2013-06-23 ENCOUNTER — Emergency Department (INDEPENDENT_AMBULATORY_CARE_PROVIDER_SITE_OTHER)
Admission: EM | Admit: 2013-06-23 | Discharge: 2013-06-23 | Disposition: A | Discharge status: Home / Self Care | Payer: Self-pay | Source: Home / Self Care | Attending: Family Medicine | Admitting: Family Medicine

## 2013-06-23 DIAGNOSIS — S32009A Unspecified fracture of unspecified lumbar vertebra, initial encounter for closed fracture: Secondary | ICD-10-CM

## 2013-06-23 DIAGNOSIS — S32010A Wedge compression fracture of first lumbar vertebra, initial encounter for closed fracture: Secondary | ICD-10-CM

## 2013-06-23 MED ORDER — HYDROCODONE-ACETAMINOPHEN 5-325 MG PO TABS: 1.0000 | ORAL_TABLET | Freq: Four times a day (QID) | ORAL | Status: DC | PRN

## 2013-06-23 NOTE — ED Notes (Signed)
Left voice mail at [phone] [facility]  Pt is scheduled on May 7th at 9.am for a CT of the lumbar spine.  Per Dr. Artis FlockKindl.  Mw,cma

## 2013-06-23 NOTE — ED Notes (Signed)
Pt here for follow up.  Recently seen in ER for compression fracture on 4/16. No primary doctor at this time.   Mild relief with pain meds.

## 2013-06-23 NOTE — Discharge Instructions (Signed)
Heat, bedrest and pain medicine as needed, activity as tolerated, see dr pool in 2 weeks for recheck, ct scan as planned.

## 2013-06-23 NOTE — ED Provider Notes (Signed)
CSN: [id number]     Arrival date & time 06/23/13  0802 History   First MD Initiated Contact with Patient 06/23/13 [phone]     Chief Complaint  Patient presents with  . Back Pain   (Consider location/radiation/quality/duration/timing/severity/associated sxs/prior Treatment) Patient is a 28 y.o. female presenting with back pain. The history is provided by the patient.  Back Pain Location:  Lumbar spine Quality:  Stiffness and shooting Stiffness is present:  All day Radiates to:  Does not radiate Pain severity:  Moderate Progression:  Unchanged Chronicity:  New Context: falling   Context comment:  Seen 4/16 in ER with cmpn fx of L1 , pain continues. Associated symptoms: no numbness     History reviewed. No pertinent past medical history. History reviewed. No pertinent past surgical history. History reviewed. No pertinent family history. History  Substance Use Topics  . Smoking status: Current Every Day Smoker -- 1.00 packs/day for 10 years    Types: Cigarettes  . Smokeless tobacco: Never Used  . Alcohol Use: Yes     Comment: Socially    OB History   Grav Para Term Preterm Abortions TAB SAB Ect Mult Living                 Review of Systems  Constitutional: Negative.   Gastrointestinal: Negative.   Genitourinary: Negative.   Musculoskeletal: Positive for back pain. Negative for gait problem.  Neurological: Negative for numbness.    Allergies  Review of patient's allergies indicates no known allergies.  Home Medications   Prior to Admission medications   Medication Sig Start Date End Date Taking? Authorizing Provider  methocarbamol (ROBAXIN) 500 MG tablet Take 1 tablet (500 mg total) by mouth 2 (two) times daily. 06/18/13  Yes Hannah Muthersbaugh, PA-C  oxyCODONE-acetaminophen (PERCOCET/ROXICET) 5-325 MG per tablet Take 1-2 tablets by mouth every 6 (six) hours as needed for severe pain. 06/18/13  Yes Hannah Muthersbaugh, PA-C   BP 142/100  Pulse 112  Temp(Src) 97.5 F  (36.4 C) (Oral)  Resp 20  SpO2 99%  LMP 05/26/2013 Physical Exam  Nursing note and vitals reviewed. Constitutional: She is oriented to person, place, and time. She appears well-developed and well-nourished.  Abdominal: Soft. Bowel sounds are normal.  Musculoskeletal: She exhibits tenderness.       Lumbar back: She exhibits bony tenderness, pain and spasm. She exhibits normal pulse.       Back:  Neurological: She is alert and oriented to person, place, and time.  Skin: Skin is warm and dry.    ED Course  Procedures (including critical care time) Labs Review Labs Reviewed - No data to display  Results for orders placed during the hospital encounter of 04/30/09  RAPID STREP SCREEN      Result Value Ref Range   Streptococcus, Group A Screen (Direct) NEGATIVE  NEGATIVE   Imaging Review Dg Lumbar Spine 2-3 Views  06/23/2013   CLINICAL DATA:  Back pain.  EXAM: LUMBAR SPINE - 2-3 VIEW  COMPARISON:  06/18/2013.  FINDINGS: Further loss of anterior vertebral body height at L1, with increased wedging and superior endplate compression, now measuring 21 mm craniocaudal as compared with 23 mm previously as assessed on the lateral radiograph. No appreciable retropulsion although No CT has been obtained.  Transitional anatomy.  S1 displays pars defects.  IMPRESSION: Further compression of L1 since the previous film less than 1 week ago.   Electronically Signed   By: Davonna BellingJohn  Curnes M.D.   On: 06/23/2013 09:01  MDM   1. Compression fracture of L1 lumbar vertebra        Linna HoffJames D Leng Montesdeoca, MD 06/23/13 1028

## 2013-06-25 NOTE — ED Provider Notes (Signed)
Medical screening examination/treatment/procedure(s) were performed by non-physician practitioner and as supervising physician I was immediately available for consultation/collaboration.    Rawad Bochicchio L Amadea Keagy, MD 06/25/13 0729 

## 2013-06-29 ENCOUNTER — Ambulatory Visit: Payer: Self-pay | Admitting: Internal Medicine

## 2013-07-02 NOTE — ED Notes (Signed)
Pt called upset wanting to know why CT of Lumbar was cancelled No note in Epic other than a note to report to Dr. Dutch QuintPoole, neurosurgeon Adv pt to keep appt on 07/09/13 w/Dr. Dutch QuintPoole and have them reschedule appt.  Pt verbalized understanding

## 2013-07-02 NOTE — ED Notes (Signed)
Pt called wanting to know why her CT Lumbar was cancelled Adv pt that there is a note that she will have to f/u w/Dr.

## 2013-07-07 NOTE — ED Notes (Signed)
[facility] radiologist called; wanting to know what if CT of lumbar was cancelled Notified her that we did not cancel it here but there is a note to f/u w/Dr. Dutch QuintPoole - nuerosurg

## 2013-07-09 ENCOUNTER — Other Ambulatory Visit (HOSPITAL_COMMUNITY): Payer: Self-pay | Admitting: Family Medicine

## 2013-07-09 ENCOUNTER — Encounter (HOSPITAL_COMMUNITY): Payer: Self-pay | Admitting: Emergency Medicine

## 2013-07-09 ENCOUNTER — Ambulatory Visit (HOSPITAL_COMMUNITY): Payer: Self-pay

## 2013-07-09 ENCOUNTER — Ambulatory Visit (HOSPITAL_COMMUNITY)
Admission: RE | Admit: 2013-07-09 | Discharge: 2013-07-09 | Disposition: A | Discharge status: Home / Self Care | Payer: Self-pay | Source: Ambulatory Visit | Attending: Family Medicine | Admitting: Family Medicine

## 2013-07-09 ENCOUNTER — Emergency Department (INDEPENDENT_AMBULATORY_CARE_PROVIDER_SITE_OTHER)
Admission: EM | Admit: 2013-07-09 | Discharge: 2013-07-09 | Disposition: A | Discharge status: Home / Self Care | Payer: Self-pay | Source: Home / Self Care | Attending: Family Medicine | Admitting: Family Medicine

## 2013-07-09 DIAGNOSIS — R52 Pain, unspecified: Secondary | ICD-10-CM

## 2013-07-09 DIAGNOSIS — W19XXXA Unspecified fall, initial encounter: Secondary | ICD-10-CM | POA: Insufficient documentation

## 2013-07-09 DIAGNOSIS — S32009A Unspecified fracture of unspecified lumbar vertebra, initial encounter for closed fracture: Secondary | ICD-10-CM | POA: Insufficient documentation

## 2013-07-09 DIAGNOSIS — S32000A Wedge compression fracture of unspecified lumbar vertebra, initial encounter for closed fracture: Secondary | ICD-10-CM

## 2013-07-09 MED ORDER — METHOCARBAMOL 500 MG PO TABS: 500.0000 mg | ORAL_TABLET | Freq: Two times a day (BID) | ORAL | Status: DC

## 2013-07-09 MED ORDER — HYDROCODONE-ACETAMINOPHEN 5-325 MG PO TABS: 1.0000 | ORAL_TABLET | Freq: Four times a day (QID) | ORAL | Status: DC | PRN

## 2013-07-09 NOTE — ED Provider Notes (Signed)
Marie Mckay is a 28 y.o. female who presents to Urgent Care today for back pain. Patient is here following CT scan for vertebral compression fracture. She had a slip and fall in the shower at home about 3-4 weeks ago. She notes continued back pain. She denies any radiating pain weakness numbness bowel bladder dysfunction or difficulty walking. Pain is severe and worse with activity.   History reviewed. No pertinent past medical history. History  Substance Use Topics  . Smoking status: Current Every Day Smoker -- 1.00 packs/day for 10 years    Types: Cigarettes  . Smokeless tobacco: Never Used  . Alcohol Use: Yes     Comment: Socially    ROS as above Medications: No current facility-administered medications for this encounter.   Current Outpatient Prescriptions  Medication Sig Dispense Refill  . HYDROcodone-acetaminophen (NORCO/VICODIN) 5-325 MG per tablet Take 1 tablet by mouth every 6 (six) hours as needed.  15 tablet  0  . methocarbamol (ROBAXIN) 500 MG tablet Take 1 tablet (500 mg total) by mouth 2 (two) times daily.  30 tablet  0    Exam:  BP 121/79  Pulse 88  Temp(Src) 98.4 F (36.9 C) (Oral)  Resp 16  SpO2 98%  LMP 06/26/2013 Gen: Well NAD Back: Tender to palpation lumbar midline. To palpation left lumbar paraspinal. Decreased range of motion secondary to pain. Strength reflexes and sensation are intact distally bilateral extremities. Normal gait.  No results found for this or any previous visit (from the past 24 hour(s)). Ct Lumbar Spine Wo Contrast  07/09/2013   CLINICAL DATA:  Status post fall 1 month ago. L1 compression fracture.  EXAM: CT LUMBAR SPINE WITHOUT CONTRAST  TECHNIQUE: Multidetector CT imaging of the lumbar spine was performed without intravenous contrast administration. Multiplanar CT image reconstructions were also generated.  COMPARISON:  Plain films lumbar spine 06/23/2013 and 06/18/2013.  FINDINGS: Again seen is transitional anatomy at the  lumbosacral junction with the last fully open disc space labeled S1-2. Based on this numbering scheme, again seen is a superior endplate compression fracture of L1. Compression appears worst anteriorly where vertebral body height measures 2.0 cm, not markedly changed compared to the most recent plain films. Minimal bony retropulsion is seen off the superior endplate but there is no central canal compromise due to the fracture. No extension into the posterior elements is identified. There is no other fracture. 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 Imaged intra-abdominal contents appear normal.  IMPRESSION: No change in the appearance of a mild superior endplate compression fracture of L1 with associated minimal bony retropulsion but no resultant central canal stenosis.  Transitional anatomy at the lumbosacral junction with bilateral S1 pars interarticularis defects and associated 0.5 cm anterolisthesis S1 on S2, unchanged.  No new abnormality since the most recent exam.   Electronically Signed   By: Drusilla Kannerhomas  Dalessio M.D.   On: 07/09/2013 10:03    Assessment and Plan: 28 y.o. female with L1 compression fracture. No neuro impingement. [facility] Norco and Robaxin for pain.  Discussed warning signs or symptoms. Please see discharge instructions. Patient expresses understanding.    Rodolph BongEvan S Trayven Lumadue, MD 07/09/13 [phone]

## 2013-07-09 NOTE — ED Notes (Signed)
Follow up on back pain  States she did CT scan on back  Pain is still persistent

## 2013-07-09 NOTE — Discharge Instructions (Signed)
Thank you for coming in today. Follow up at the sports medicine center tomorrow at 11:30am Please call or see Ms Antionette CharMaggy Mena for assistance with your bill.  You may qualify for reduced or free services.  Her phone number is [phone]. Her email is [email or web address]   Back, Compression Fracture A compression fracture happens when a force is put upon the length of your spine. Slipping and falling on your bottom are examples of such a force. When this happens, sometimes the force is great enough to compress the building blocks (vertebral bodies) of your spine. Although this causes a lot of pain, this can usually be treated at home, unless your caregiver feels hospitalization is needed for pain control. Your backbone (spinal column) is made up of 24 main vertebral bodies in addition to the sacrum and coccyx (see illustration). These are held together by tough fibrous tissues (ligaments) and by support of your muscles. Nerve roots pass through the openings between the vertebrae. A sudden wrenching move, injury, or a fall may cause a compression fracture of one of the vertebral bodies. This may result in back pain or spread of pain into the belly (abdomen), the buttocks, and down the leg into the foot. Pain may also be created by muscle spasm alone. Large studies have been undertaken to determine the best possible course of action to help your back following injury and also to prevent future problems. The recommendations are as follows. FOLLOWING A COMPRESSION FRACTURE: Do the following only if advised by your caregiver.   If a back brace has been suggested or provided, wear it as directed.  DO NOT stop wearing the back brace unless instructed by your caregiver.  When allowed to return to regular activities, avoid a sedentary life style. Actively exercise. Sporadic weekend binges of tennis, racquetball, water skiing, may actually aggravate or create problems, especially if you are  not in condition for that activity.  Avoid sports requiring sudden body movements until you are in condition for them. Swimming and walking are safer activities.  Maintain good posture.  Avoid obesity.  If not already done, you should have a DEXA scan. Based on the results, be treated for osteoporosis. FOLLOWING ACUTE (SUDDEN) INJURY:  Only take over-the-counter or prescription medicines for pain, discomfort, or fever as directed by your caregiver.  Use bed rest for only the most extreme acute episode. Prolonged bed rest may aggravate your condition. Ice used for acute conditions is effective. Use a large plastic bag filled with ice. Wrap it in a towel. This also provides excellent pain relief. This may be continuous. Or use it for 30 minutes every 2 hours during acute phase, then as needed. Heat for 30 minutes prior to activities is helpful.  As soon as the acute phase (the time when your back is too painful for you to do normal activities) is over, it is important to resume normal activities and work Arboriculturisthardening programs. Back injuries can cause potentially marked changes in lifestyle. So it is important to attack these problems aggressively.  See your caregiver for continued problems. He or she can help or refer you for appropriate exercises, physical therapy and work hardening if needed.  If you are given narcotic medications for your condition, for the next 24 hours DO NOT:  Drive  Operate machinery or power tools.  Sign legal documents.  DO NOT drink alcohol, take sleeping pills or other medications that may interfere with treatment. If your caregiver has given you a follow-up appointment, it  is very important to keep that appointment. Not keeping the appointment could result in a chronic or permanent injury, pain, and disability. If there is any problem keeping the appointment, you must call back to this facility for assistance.  SEEK IMMEDIATE MEDICAL CARE IF:  You develop  numbness, tingling, weakness, or problems with the use of your arms or legs.  You develop severe back pain not relieved with medications.  You have changes in bowel or bladder control.  You have increasing pain in any areas of the body. Document Released: 02/19/2005 Document Revised: 05/14/2011 Document Reviewed: 09/24/2007 [facility] Patient Information 2014 [employer] [place].

## 2013-07-10 ENCOUNTER — Encounter: Payer: Self-pay | Admitting: Sports Medicine

## 2013-07-10 ENCOUNTER — Ambulatory Visit (INDEPENDENT_AMBULATORY_CARE_PROVIDER_SITE_OTHER): Payer: Self-pay | Admitting: Sports Medicine

## 2013-07-10 VITALS — BP 134/88 | Ht 71.0 in | Wt 200.0 lb

## 2013-07-10 DIAGNOSIS — S32010A Wedge compression fracture of first lumbar vertebra, initial encounter for closed fracture: Secondary | ICD-10-CM

## 2013-07-10 DIAGNOSIS — S32009A Unspecified fracture of unspecified lumbar vertebra, initial encounter for closed fracture: Secondary | ICD-10-CM

## 2013-07-10 MED ORDER — OXYCODONE-ACETAMINOPHEN 10-325 MG PO TABS: 1.0000 | ORAL_TABLET | Freq: Three times a day (TID) | ORAL | Status: DC | PRN

## 2013-07-10 NOTE — Progress Notes (Signed)
   Subjective:    Patient ID: Marie Mckay, female    DOB: 1986/01/14, 28 y.o.   MRN: [id number]  HPI chief complaint: Low back pain  28 year old female comes in today complaining of 3 weeks of low back pain. She slipped in the shower landing directly on her buttocks suffering an axial loading injury to her lower back. She had immediate pain but did not seek treatment in the emergency room until 3 days later. An x-ray at that time showed evidence of an L1 compression fracture. She was treated with pain medication and discharged but when her pain persisted she return to the emergency room yesterday. A CT scan was then performed. She was referred to our office for further evaluation and treatment. She localizes all of her pain to her low back. Some burning discomfort into her buttocks but no radiating pain into her legs. No numbness and tingling in her legs. She's not noticed any weakness in her legs. She denies any change in bowel or bladder. No significant problems with her low back in the past. She was given a prescription for hydrocodone 5/325 but has had to take 2 for symptom relief.  Past medical history reviewed. She denies any significant past medical history Current medications reviewed No known drug allergies She is a current smoker smoking a pack per day    Review of Systems    as above Objective:   Physical Exam Obese. No acute distress. She is in some slight discomfort due to her pain.  Lumbar spine: There is tenderness to palpation and percussion directly over the L1 vertebral body. Some mild spasm of the paraspinal musculature as well.  Neurological exam: She has good strength of both lower extremities and sensation is intact. She has brisk and equal reflexes at the Achilles and patellar tendons. No clonus.  X-rays and CT scan of the lumbar spine are reviewed. There is a superior endplate compression fracture of L1. No central cord compromise is seen. No extension into the  posterior elements is identified. No other fractures. When comparing the CT scan to her initial x-ray from April 16, there has been no further compression of her fracture. Incidental note of bilateral S1 pars interarticularis defect is also noted with a grade 1 spondylolisthesis of S1 on S2.       Assessment & Plan:  Low back pain secondary to L1 lumbar compression fracture  Injury is now 613 weeks old and compression fracture appears to be stable. There are no worrisome neurological findings on exam nor does there appear to be any cord compromise on the CT scan. I fitted her with a lumbar corset for comfort. I've also given her a prescription for Percocet 10/325 to take up to 3 times a day as needed for pain. I explained to her that I want her to take this only sparingly and to be careful with any other over-the-counter acetaminophen usage. She will followup with me in 3 weeks and she will get followup x-rays one day prior to that appointment. I've explained to her that these types of fractures are typically fairly stable but we will monitor with serial radiographs. If she develops any neurological symptoms then I would consider MRI. She will call with questions or concerns prior to her followup visit.

## 2013-07-10 NOTE — Patient Instructions (Signed)
Wear your lumbar brace for comfort  I've given you a prescription for Percocet to take as needed for pain but take it only sparingly  I will see you in 3 weeks. Make sure you get a new x-ray of her back that day before that office visit

## 2013-07-29 ENCOUNTER — Ambulatory Visit (HOSPITAL_COMMUNITY): Admission: RE | Admit: 2013-07-29 | Payer: Self-pay | Source: Ambulatory Visit | Admitting: Sports Medicine

## 2013-07-29 ENCOUNTER — Ambulatory Visit (HOSPITAL_COMMUNITY)
Admission: RE | Admit: 2013-07-29 | Discharge: 2013-07-29 | Disposition: A | Discharge status: Home / Self Care | Payer: Self-pay | Source: Ambulatory Visit | Attending: Sports Medicine | Admitting: Sports Medicine

## 2013-07-29 DIAGNOSIS — IMO0002 Reserved for concepts with insufficient information to code with codable children: Secondary | ICD-10-CM | POA: Insufficient documentation

## 2013-07-29 DIAGNOSIS — S32010A Wedge compression fracture of first lumbar vertebra, initial encounter for closed fracture: Secondary | ICD-10-CM

## 2013-07-29 DIAGNOSIS — M549 Dorsalgia, unspecified: Secondary | ICD-10-CM | POA: Insufficient documentation

## 2013-07-30 ENCOUNTER — Encounter: Payer: Self-pay | Admitting: Sports Medicine

## 2013-07-30 ENCOUNTER — Ambulatory Visit (INDEPENDENT_AMBULATORY_CARE_PROVIDER_SITE_OTHER): Payer: Self-pay | Admitting: Sports Medicine

## 2013-07-30 VITALS — BP 159/102 | Ht 71.0 in | Wt 200.0 lb

## 2013-07-30 DIAGNOSIS — S32010A Wedge compression fracture of first lumbar vertebra, initial encounter for closed fracture: Secondary | ICD-10-CM

## 2013-07-30 DIAGNOSIS — S22080A Wedge compression fracture of T11-T12 vertebra, initial encounter for closed fracture: Secondary | ICD-10-CM | POA: Insufficient documentation

## 2013-07-30 DIAGNOSIS — M545 Low back pain, unspecified: Secondary | ICD-10-CM

## 2013-07-30 DIAGNOSIS — S32009A Unspecified fracture of unspecified lumbar vertebra, initial encounter for closed fracture: Secondary | ICD-10-CM

## 2013-07-30 MED ORDER — HYDROCODONE-ACETAMINOPHEN 5-325 MG PO TABS: ORAL_TABLET | ORAL | Status: DC

## 2013-07-30 NOTE — Progress Notes (Signed)
   Subjective:    Patient ID: Marie Mckay, female    DOB: 31-Jan-1986, 28 y.o.   MRN: [id number]  HPI Patient comes in today for followup on her L1 compression fracture. Overall, she is improved. It's been 6 weeks since her injury. However she is still experiencing intermittent pain. Her car broke down in the middle of the road last week and she had to help push at the way. She has also taken a part-time job cleaning houses. She is wearing her brace intermittently. She still denies numbness or tingling in her legs. No change in bowel or bladder habits. She has taken all of her Percocet.    Review of Systems     Objective:   Physical Exam Well-developed, well-nourished. Sitting comfortably in the exam room. No acute distress.  There is still tenderness to palpation and percussion directly over the L1 vertebral body. No spasm. Neurological exam shows no focal neurological deficits of either lower extremity.  X-rays of the lumbar spine including AP and lateral views dated 07/29/2013 are reviewed. They're compared to previous x-rays and CT scan of the same area. The L1 compression fracture appears to be stable. There is no further compression. Overall there appears to be about a 30% loss of the anterior vertebral height. Once again noted are chronic pars defects at L5-S1.       Assessment & Plan:  6 weeks status post L1 compression fracture  Fracture appears to be currently stable. However, patient is still experiencing pain. I've agreed to give her Vicodin 5/325 to take twice daily as needed for severe pain, 60 pills with no refills. She understands that this is a one-time prescription and no further refills will be approved. She is reassured that her fracture appears stable but she also understands that if she does too much in the way of physical activity that her pain will persist for several more weeks. I think she is okay to use her lumbar corset as needed and I will see her back in the  office in 6 weeks. We will get one final x-ray prior to that visit to confirm continued fracture stability.

## 2013-09-06 ENCOUNTER — Ambulatory Visit (HOSPITAL_COMMUNITY)
Admission: RE | Admit: 2013-09-06 | Discharge: 2013-09-06 | Disposition: A | Discharge status: Home / Self Care | Payer: Self-pay | Source: Ambulatory Visit | Attending: Sports Medicine | Admitting: Sports Medicine

## 2013-09-06 DIAGNOSIS — Y929 Unspecified place or not applicable: Secondary | ICD-10-CM | POA: Insufficient documentation

## 2013-09-06 DIAGNOSIS — M545 Low back pain, unspecified: Secondary | ICD-10-CM

## 2013-09-06 DIAGNOSIS — Q762 Congenital spondylolisthesis: Secondary | ICD-10-CM | POA: Insufficient documentation

## 2013-09-06 DIAGNOSIS — S22009A Unspecified fracture of unspecified thoracic vertebra, initial encounter for closed fracture: Secondary | ICD-10-CM | POA: Insufficient documentation

## 2013-09-06 DIAGNOSIS — X58XXXA Exposure to other specified factors, initial encounter: Secondary | ICD-10-CM | POA: Insufficient documentation

## 2013-09-07 ENCOUNTER — Ambulatory Visit (INDEPENDENT_AMBULATORY_CARE_PROVIDER_SITE_OTHER): Payer: Self-pay | Admitting: Sports Medicine

## 2013-09-07 VITALS — BP 138/88 | Ht 70.0 in | Wt 200.0 lb

## 2013-09-07 DIAGNOSIS — IMO0002 Reserved for concepts with insufficient information to code with codable children: Secondary | ICD-10-CM

## 2013-09-07 DIAGNOSIS — M546 Pain in thoracic spine: Secondary | ICD-10-CM

## 2013-09-07 DIAGNOSIS — S22080S Wedge compression fracture of T11-T12 vertebra, sequela: Secondary | ICD-10-CM

## 2013-09-07 MED ORDER — TRAMADOL HCL 50 MG PO TABS: 50.0000 mg | ORAL_TABLET | Freq: Four times a day (QID) | ORAL | Status: DC | PRN

## 2013-09-07 NOTE — Progress Notes (Signed)
  Marie Mckay - 28 y.o. female MRN [id number]  Date of birth: 05/03/1985    SUBJECTIVE:     She reports the pain is unchanged from previous. She experiences "tightness/twisting" sensations when she wakes-up that improve with some stretching. 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 She also take ~ 2 Vicodin to help with the pain. She hasn't tried any PT or routine stretching program.   DG Lumbar 09/06/13: Reviewed and shows stable T12 compression fracture, as well as the mild L1 anteriolisthesis  ROS:     Denies any numbness, dec strength, fevers, or sciatica   PERTINENT  PMH / PSH FH / / SH:  Past Medical, Surgical, Social, and Family History Reviewed & Updated per EMR.  Pertinent Historical Findings include:  - Previous fall in shower resulting in T12 compression fracture  OBJECTIVE: BP 138/88  Ht 5\' 10"  (1.778 m)  Wt 200 lb (90.719 kg)  BMI 28.70 kg/m2  LMP 08/26/2013  Physical Exam:  Vital signs are reviewed.  Back  Spinal process tenderness ~ t12; Left lumbar paraspinal tenderness w/o obvious inc muscle tension  Strength intact: 5/5 Knee ext & flexion, ankle dorsiflexion & plantar flexion   Sensation intact bilaterally  Reflexes: Patellar and Achilles 2+ b/l  Negative straight leg and cross leg raise test  ASSESSMENT & PLAN:  See problem based charting & AVS for pt instructions.

## 2013-09-07 NOTE — Assessment & Plan Note (Addendum)
Pertinent Subjective  Pain unchanged with no new findings: No red flags Assessment  Spinal process tenderness at T12  Xrays reviewed and stable Plan  Ultram 50 mg, #60 given with no refills  Given her continued pain we will proceed with an MRI to evaluate both the T12 compression fracture as well as the grade 1 spondylolisthesis at L5-S1. I will call her with those results once available  IF no concerning findings on MRI- will implement physical therapy and stretching routine

## 2013-09-12 ENCOUNTER — Inpatient Hospital Stay: Admission: RE | Admit: 2013-09-12 | Payer: Self-pay | Source: Ambulatory Visit

## 2015-04-13 IMAGING — CR DG LUMBAR SPINE 2-3V
3 series · 3 of 3 positions shown · non-contrast
Comparison: 07/29/2013, 06/18/2013

CLINICAL DATA: Spinal injury 3 months ago. Pain and tingling into
the lower back radiating into the legs

EXAM:
LUMBAR SPINE - 2-3 VIEW

[t l-spine a.p.]
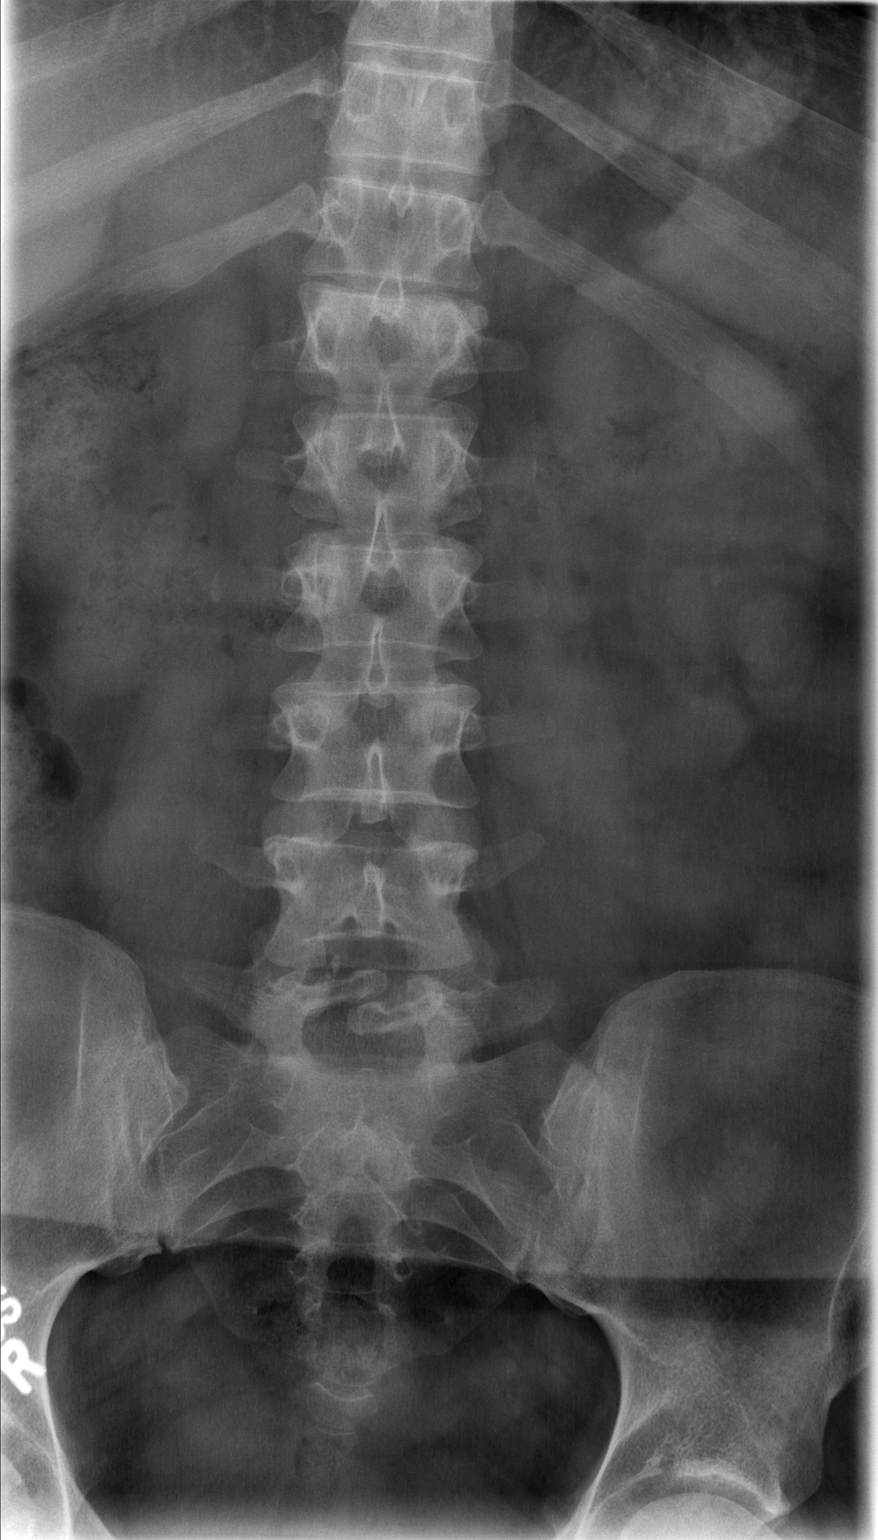

[t l-spine lat]
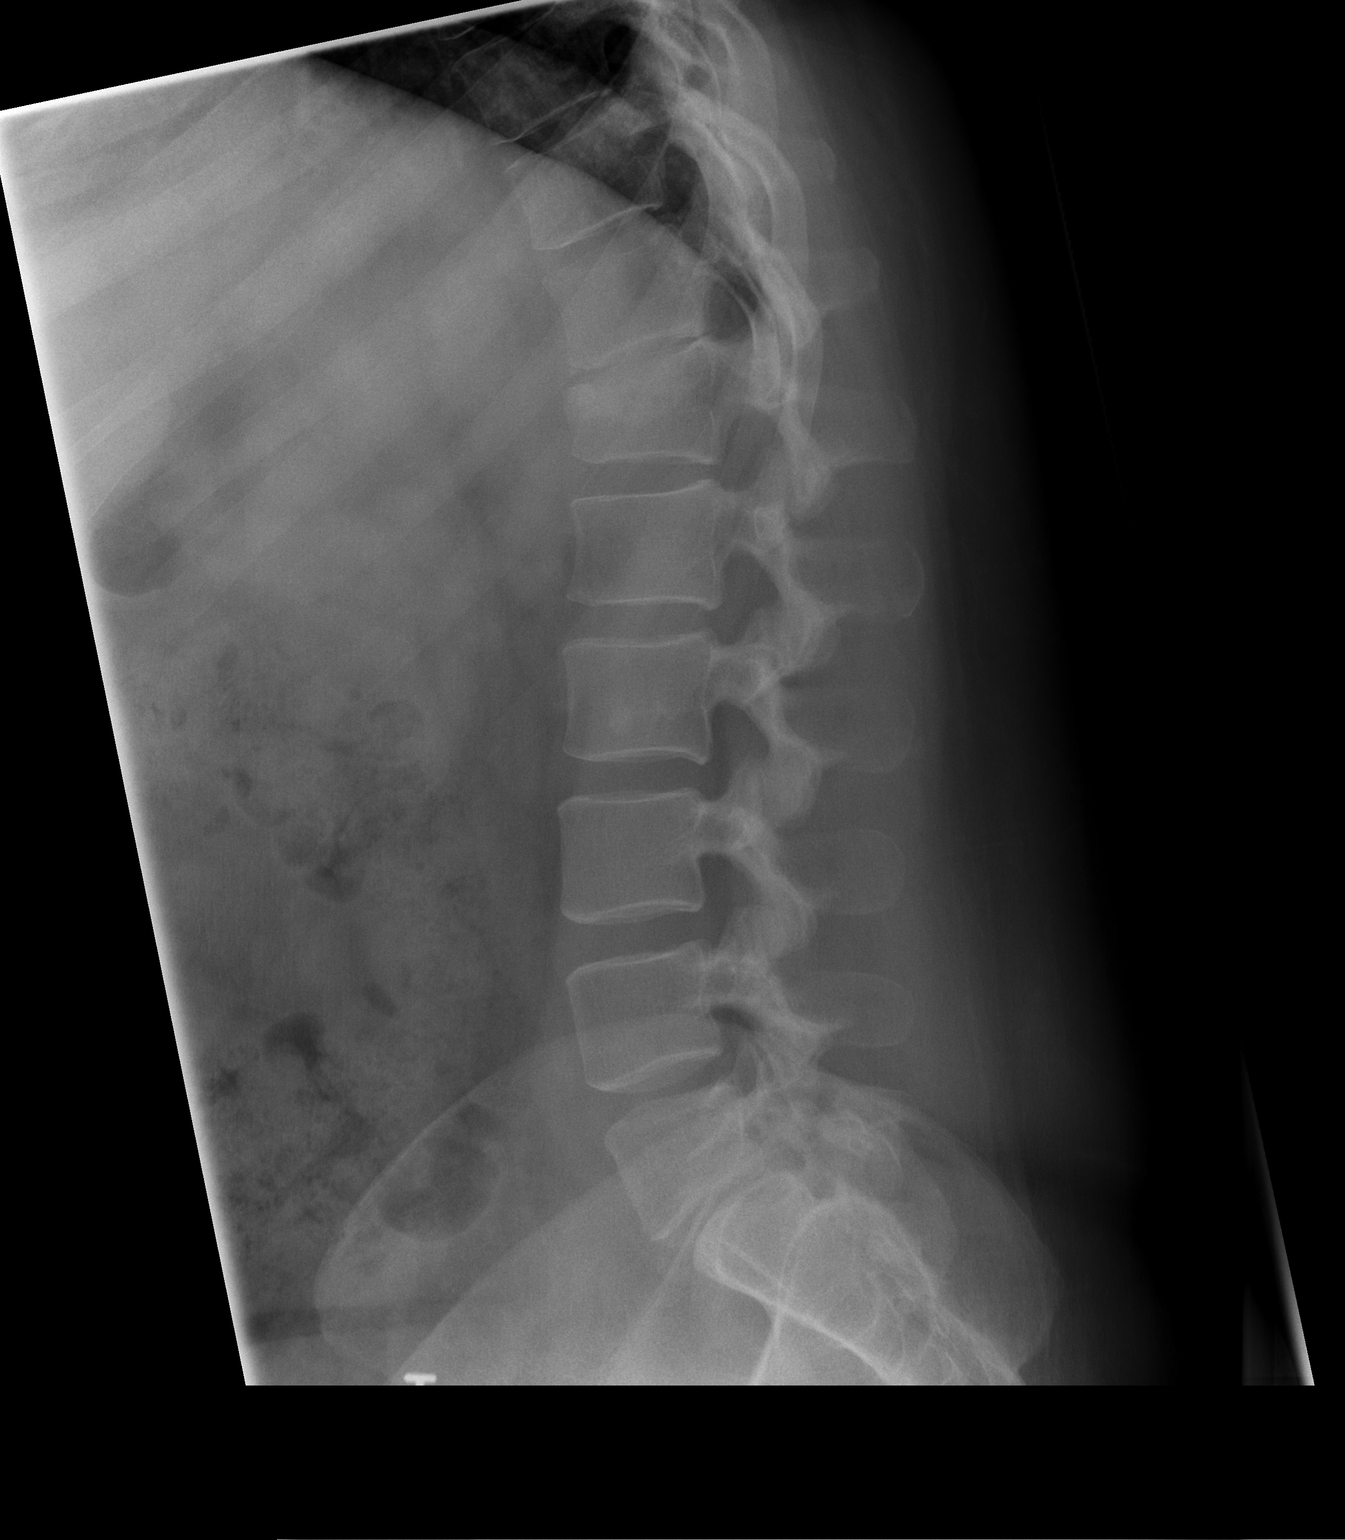

[t l-spine l5-s1 spot]
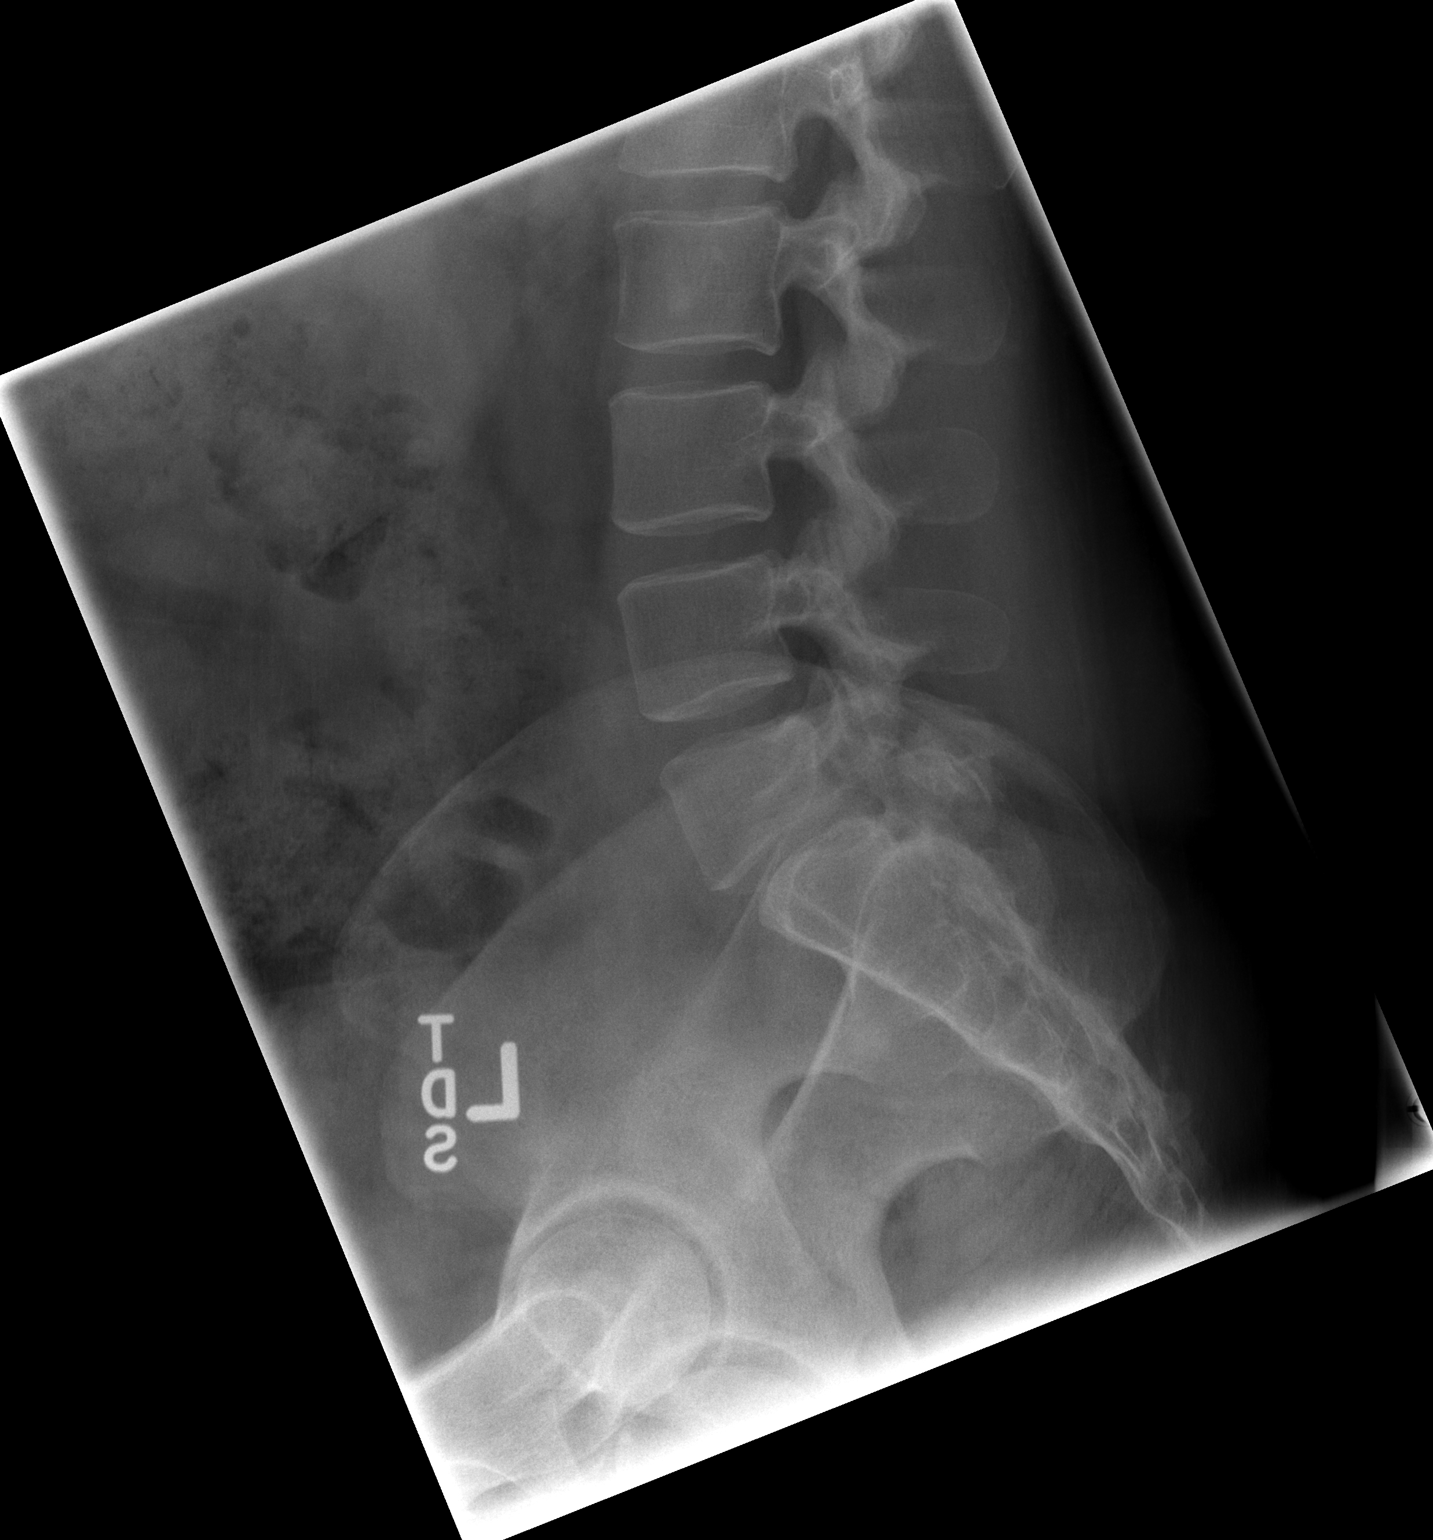

[3 of 3 positions shown; findings below may reference images not displayed]

FINDINGS: There is a T12 anterior vertebral body compression fracture
unchanged compared with the prior exam. The remainder of the
vertebral body heights are maintained. Minimal grade 1
anterolisthesis of L5 on S1 unchanged compared with 07/29/2013
secondary to bilateral L5 pars interarticularis defects. There is
disc space narrowing at L5-S1.
IMPRESSION: Stable, subacute T12 vertebral body compression fracture.

## 2016-08-16 ENCOUNTER — Emergency Department (HOSPITAL_COMMUNITY)
Admission: EM | Admit: 2016-08-16 | Discharge: 2016-08-17 | Disposition: A | Discharge status: Home / Self Care | Payer: Self-pay | Attending: Emergency Medicine | Admitting: Emergency Medicine

## 2016-08-16 ENCOUNTER — Encounter (HOSPITAL_COMMUNITY): Payer: Self-pay | Admitting: Family Medicine

## 2016-08-16 DIAGNOSIS — Z79899 Other long term (current) drug therapy: Secondary | ICD-10-CM | POA: Insufficient documentation

## 2016-08-16 DIAGNOSIS — W19XXXA Unspecified fall, initial encounter: Secondary | ICD-10-CM

## 2016-08-16 DIAGNOSIS — M79604 Pain in right leg: Secondary | ICD-10-CM

## 2016-08-16 DIAGNOSIS — W01198A Fall on same level from slipping, tripping and stumbling with subsequent striking against other object, initial encounter: Secondary | ICD-10-CM | POA: Insufficient documentation

## 2016-08-16 DIAGNOSIS — F17210 Nicotine dependence, cigarettes, uncomplicated: Secondary | ICD-10-CM | POA: Insufficient documentation

## 2016-08-16 HISTORY — DX: Unspecified dislocation of unspecified ulnohumeral joint, initial encounter: S53.106A

## 2016-08-16 LAB — PREGNANCY, URINE: PREG TEST UR: NEGATIVE

## 2016-08-16 MED ORDER — HYDROCODONE-ACETAMINOPHEN 5-325 MG PO TABS
2.0000 | ORAL_TABLET | Freq: Once | ORAL | Status: AC
  Administered 2016-08-17: 2 via ORAL
  Filled 2016-08-16: qty 2

## 2016-08-16 NOTE — ED Triage Notes (Signed)
Patient reports 3 days ago she stepped off a curb when her right ankle rolled. Pt is complaining of lower back pain, right knee pain, and right wrist pain. Pt is ambulatory back to room.

## 2016-08-16 NOTE — ED Provider Notes (Signed)
[facility]-EMERGENCY DEPT Provider Note   CSN: [id number] Arrival date & time: 08/16/16  2043     History   Chief Complaint Chief Complaint  Patient presents with  . Fall    HPI Marie Mckay is a 31 y.o. female.  Patient presents to the ED with a chief complaint of fall.  She states that she rolled her ankle and fell to the ground landing on her knees.  She reports associated pain in her back and reports prior compression fracture of her T12 vertebra.  She reports that she has taken some valium and NSAIDs with no relief.    The history is provided by the patient. No language interpreter was used.    Past Medical History:  Diagnosis Date  . Dislocated elbow    Left     Patient Active Problem List   Diagnosis Date Noted  . Compression fracture of T12 vertebra w/ mild L1 anteriolisthesis 07/30/2013    History reviewed. No pertinent surgical history.  OB History    No data available       Home Medications    Prior to Admission medications   Medication Sig Start Date End Date Taking? Authorizing Provider  HYDROcodone-acetaminophen (NORCO/VICODIN) 5-325 MG per tablet Take 1 tablet by mouth every 6 (six) hours as needed. 07/09/13   Rodolph Bong, MD  HYDROcodone-acetaminophen (NORCO/VICODIN) 5-325 MG per tablet TAKE ONE BID PRN 07/30/13   Ralene Cork, DO  methocarbamol (ROBAXIN) 500 MG tablet Take 1 tablet (500 mg total) by mouth 2 (two) times daily. 07/09/13   Rodolph Bong, MD  oxyCODONE-acetaminophen (PERCOCET) 10-325 MG per tablet Take 1 tablet by mouth every 8 (eight) hours as needed for pain. 07/10/13   Ralene Cork, DO  traMADol (ULTRAM) 50 MG tablet Take 1 tablet (50 mg total) by mouth every 6 (six) hours as needed. 09/07/13   Jamal Collin, MD    Family History History reviewed. No pertinent family history.  Social History Social History  Substance Use Topics  . Smoking status: Current Every Day Smoker    Packs/day: 1.00    Years: 10.00    Types:  Cigarettes  . Smokeless tobacco: Never Used  . Alcohol use Yes     Comment: Twice a week.      Allergies   Patient has no known allergies.   Review of Systems Review of Systems  All other systems reviewed and are negative.    Physical Exam Updated Vital Signs BP (!) 161/119 (BP Location: Left Arm)   Pulse (!) 132   Temp 99.5 F (37.5 C) (Oral)   Resp 20   Ht 5\' 9"  (1.753 m)   Wt 90.7 kg (200 lb)   LMP 07/17/2016   SpO2 100%   BMI 29.53 kg/m   Physical Exam Nursing note and vitals reviewed.  Constitutional: Pt appears well-developed and well-nourished. No distress.  HENT:  Head: Normocephalic and atraumatic.  Eyes: Conjunctivae are normal.  Neck: Normal range of motion.  Cardiovascular: Normal rate, regular rhythm. Intact distal pulses.   Capillary refill < 3 sec.  Pulmonary/Chest: Effort normal and breath sounds normal.  Musculoskeletal:  Right lower extremity Pt exhibits TTP of knee laterally with abrasion, no erythema or sign of infection.  TTP of right ankle laterally  ROM: 3/5 limited by pain  Strength: 3/5 limited by pain   Neurological: Pt  is alert. Coordination normal.  Sensation: 5/5 Skin: Skin is warm and dry. Pt is not diaphoretic.  No evidence of open wound or skin tenting Psychiatric: Pt has a normal mood and affect.     ED Treatments / Results  Labs (all labs ordered are listed, but only abnormal results are displayed) Labs Reviewed - No data to display  EKG  EKG Interpretation None       Radiology No results found.  Procedures Procedures (including critical care time)  Medications Ordered in ED Medications  HYDROcodone-acetaminophen (NORCO/VICODIN) 5-325 MG per tablet 2 tablet (not administered)     Initial Impression / Assessment and Plan / ED Course  I have reviewed the triage vital signs and the nursing notes.  Pertinent labs & imaging results that were available during my care of the patient were reviewed by me and  considered in my medical decision making (see chart for details).     Patient X-Ray negative for acute process.  Pt advised to follow up with orthopedics. Patient given brace and crutches while in ED, conservative therapy recommended and discussed. Patient will be discharged home & is agreeable with above plan. Returns precautions discussed. Pt appears safe for discharge.   Final Clinical Impressions(s) / ED Diagnoses   Final diagnoses:  Fall, initial encounter  Pain of right lower extremity    New Prescriptions New Prescriptions   CYCLOBENZAPRINE (FLEXERIL) 10 MG TABLET    Take 1 tablet (10 mg total) by mouth 2 (two) times daily as needed for muscle spasms.   IBUPROFEN (ADVIL,MOTRIN) 600 MG TABLET    Take 1 tablet (600 mg total) by mouth every 6 (six) hours as needed.     Roxy HorsemanBrowning, Jessica Checketts, PA-C 08/17/16 0125    Devoria AlbeKnapp, Iva, MD 08/17/16 [phone]

## 2016-08-17 ENCOUNTER — Emergency Department (HOSPITAL_COMMUNITY): Payer: Self-pay

## 2016-08-17 MED ORDER — CYCLOBENZAPRINE HCL 10 MG PO TABS: 10.0000 mg | ORAL_TABLET | Freq: Two times a day (BID) | ORAL | 0 refills | Status: DC | PRN

## 2016-08-17 MED ORDER — IBUPROFEN 600 MG PO TABS: 600.0000 mg | ORAL_TABLET | Freq: Four times a day (QID) | ORAL | 0 refills | Status: DC | PRN

## 2016-08-17 NOTE — ED Notes (Signed)
Patient is alert and oriented x3.  She was given DC instructions and follow up visit instructions.  Patient gave verbal understanding. She was DC ambulatory under her own power to home.  V/S stable.  He was not showing any signs of distress on DC 

## 2017-11-18 ENCOUNTER — Encounter (HOSPITAL_COMMUNITY): Payer: Self-pay | Admitting: *Deleted

## 2017-11-18 ENCOUNTER — Emergency Department (HOSPITAL_COMMUNITY)
Admission: EM | Admit: 2017-11-18 | Discharge: 2017-11-18 | Disposition: A | Discharge status: Home / Self Care | Payer: Self-pay | Attending: Emergency Medicine | Admitting: Emergency Medicine

## 2017-11-18 ENCOUNTER — Other Ambulatory Visit: Payer: Self-pay

## 2017-11-18 DIAGNOSIS — K047 Periapical abscess without sinus: Secondary | ICD-10-CM | POA: Insufficient documentation

## 2017-11-18 DIAGNOSIS — F17210 Nicotine dependence, cigarettes, uncomplicated: Secondary | ICD-10-CM | POA: Insufficient documentation

## 2017-11-18 DIAGNOSIS — Z79899 Other long term (current) drug therapy: Secondary | ICD-10-CM | POA: Insufficient documentation

## 2017-11-18 LAB — CBC WITH DIFFERENTIAL/PLATELET
Abs Immature Granulocytes: 0 10*3/uL (ref 0.0–0.1)
Basophils Absolute: 0 10*3/uL (ref 0.0–0.1)
Basophils Relative: 0 %
Eosinophils Absolute: 0.2 10*3/uL (ref 0.0–0.7)
Eosinophils Relative: 1 %
HCT: 47.2 % — ABNORMAL HIGH (ref 36.0–46.0)
Hemoglobin: 15.8 g/dL — ABNORMAL HIGH (ref 12.0–15.0)
Immature Granulocytes: 0 %
Lymphocytes Relative: 18 %
Lymphs Abs: 2.7 10*3/uL (ref 0.7–4.0)
MCH: 31.7 pg (ref 26.0–34.0)
MCHC: 33.5 g/dL (ref 30.0–36.0)
MCV: 94.6 fL (ref 78.0–100.0)
Monocytes Absolute: 0.7 10*3/uL (ref 0.1–1.0)
Monocytes Relative: 5 %
Neutro Abs: 11.3 10*3/uL — ABNORMAL HIGH (ref 1.7–7.7)
Neutrophils Relative %: 76 %
Platelets: 295 10*3/uL (ref 150–400)
RBC: 4.99 MIL/uL (ref 3.87–5.11)
RDW: 14.1 % (ref 11.5–15.5)
WBC: 14.9 10*3/uL — ABNORMAL HIGH (ref 4.0–10.5)

## 2017-11-18 LAB — I-STAT BETA HCG BLOOD, ED (MC, WL, AP ONLY): I-stat hCG, quantitative: <5 m[IU]/mL (ref <5)

## 2017-11-18 LAB — COMPREHENSIVE METABOLIC PANEL
ALT: 21 U/L (ref 0–44)
AST: 21 U/L (ref 15–41)
Albumin: 4 g/dL (ref 3.5–5.0)
Alkaline Phosphatase: 67 U/L (ref 38–126)
Anion gap: 11 (ref 5–15)
BUN: 5 mg/dL — ABNORMAL LOW (ref 6–20)
CO2: 25 mmol/L (ref 22–32)
Calcium: 9.6 mg/dL (ref 8.9–10.3)
Chloride: 105 mmol/L (ref 98–111)
Creatinine, Ser: 0.78 mg/dL (ref 0.44–1.00)
GFR calc Af Amer: >60 mL/min (ref >60)
GFR calc non Af Amer: >60 mL/min (ref >60)
Glucose, Bld: 99 mg/dL (ref 70–99)
Potassium: 3.6 mmol/L (ref 3.5–5.1)
Sodium: 141 mmol/L (ref 135–145)
Total Bilirubin: 1.1 mg/dL (ref 0.3–1.2)
Total Protein: 7.5 g/dL (ref 6.5–8.1)

## 2017-11-18 LAB — I-STAT CG4 LACTIC ACID, ED: Lactic Acid, Venous: 1.14 mmol/L (ref 0.5–1.9)

## 2017-11-18 MED ORDER — HYDROCODONE-ACETAMINOPHEN 5-325 MG PO TABS: ORAL_TABLET | ORAL | 0 refills | Status: AC

## 2017-11-18 MED ORDER — CLINDAMYCIN HCL 150 MG PO CAPS: 300.0000 mg | ORAL_CAPSULE | Freq: Four times a day (QID) | ORAL | 0 refills | Status: AC

## 2017-11-18 MED ORDER — CLINDAMYCIN HCL 150 MG PO CAPS
300.0000 mg | ORAL_CAPSULE | Freq: Once | ORAL | Status: AC
  Administered 2017-11-18: 300 mg via ORAL
  Filled 2017-11-18: qty 2

## 2017-11-18 NOTE — ED Provider Notes (Signed)
[facility]   CSN: [id number] Arrival date & time: 11/18/17  1241     History   Chief Complaint Chief Complaint  Patient presents with  . Dental Pain    HPI Marie Mckay is a 32 y.o. female.  Patient presents the emergency department with acute onset of left lower jaw swelling and pain 2 days ago.  Patient has had pain with chewing but is able to swallow.  No documented fevers.  She has been taking over-the-counter medications without relief.  No nausea or vomiting.  Onset of symptoms acute.  Course is constant.  Nothing makes symptoms better.     Past Medical History:  Diagnosis Date  . Dislocated elbow    Left     Patient Active Problem List   Diagnosis Date Noted  . Compression fracture of T12 vertebra w/ mild L1 anteriolisthesis 07/30/2013    History reviewed. No pertinent surgical history.   OB History   None      Home Medications    Prior to Admission medications   Medication Sig Start Date End Date Taking? Authorizing Provider  cyclobenzaprine (FLEXERIL) 10 MG tablet Take 1 tablet (10 mg total) by mouth 2 (two) times daily as needed for muscle spasms. 08/17/16   Roxy HorsemanBrowning, Robert, PA-C  HYDROcodone-acetaminophen (NORCO/VICODIN) 5-325 MG per tablet Take 1 tablet by mouth every 6 (six) hours as needed. 07/09/13   Rodolph Bongorey, Evan S, MD  HYDROcodone-acetaminophen (NORCO/VICODIN) 5-325 MG per tablet TAKE ONE BID PRN 07/30/13   Ralene Corkraper, Timothy R, DO  ibuprofen (ADVIL,MOTRIN) 600 MG tablet Take 1 tablet (600 mg total) by mouth every 6 (six) hours as needed. 08/17/16   Roxy HorsemanBrowning, Robert, PA-C  methocarbamol (ROBAXIN) 500 MG tablet Take 1 tablet (500 mg total) by mouth 2 (two) times daily. 07/09/13   Rodolph Bongorey, Evan S, MD  oxyCODONE-acetaminophen (PERCOCET) 10-325 MG per tablet Take 1 tablet by mouth every 8 (eight) hours as needed for pain. 07/10/13   Ralene Corkraper, Timothy R, DO  traMADol (ULTRAM) 50 MG tablet Take 1 tablet (50 mg  total) by mouth every 6 (six) hours as needed. 09/07/13   Jamal CollinJoyner, James R, MD    Family History History reviewed. No pertinent family history.  Social History Social History   Tobacco Use  . Smoking status: Current Every Day Smoker    Packs/day: 1.00    Years: 10.00    Pack years: 10.00    Types: Cigarettes  . Smokeless tobacco: Never Used  Substance Use Topics  . Alcohol use: Yes    Comment: Twice a week.   . Drug use: No     Allergies   Patient has no known allergies.   Review of Systems Review of Systems  Constitutional: Negative for fever.  HENT: Positive for dental problem and facial swelling. Negative for ear pain, sore throat and trouble swallowing.   Respiratory: Negative for shortness of breath and stridor.   Musculoskeletal: Negative for neck pain.  Skin: Negative for color change.  Neurological: Negative for headaches.     Physical Exam Updated Vital Signs BP (!) 139/102   Pulse (!) 116   Temp 99 F (37.2 C) (Oral)   Resp 18   SpO2 99%   Physical Exam  Constitutional: She appears well-developed and well-nourished.  HENT:  Head: Normocephalic and atraumatic.  Right Ear: Tympanic membrane, external ear and ear canal normal.  Left Ear: Tympanic membrane, external ear and ear canal normal.  Nose: Nose normal.  Mouth/Throat: Uvula is midline, oropharynx is clear and moist and mucous membranes are normal. No trismus in the jaw. Abnormal dentition. Dental caries present. No dental abscesses or uvula swelling. No tonsillar abscesses.  Patient with 2 cm area of firmness and swelling to the right mandibular jaw area consistent with abscess.  Patient with significant tenderness to palpation.  No trismus.  Patient handling her secretions.  Eyes: Conjunctivae are normal.  Neck: Normal range of motion. Neck supple.  No neck swelling or Ludwig's angina  Lymphadenopathy:    She has no cervical adenopathy.  Neurological: She is alert.  Skin: Skin is warm and dry.   Psychiatric: She has a normal mood and affect.  Nursing note and vitals reviewed.    ED Treatments / Results  Labs (all labs ordered are listed, but only abnormal results are displayed) Labs Reviewed  COMPREHENSIVE METABOLIC PANEL - Abnormal; Notable for the following components:      Result Value   BUN 5 (*)    All other components within normal limits  CBC WITH DIFFERENTIAL/PLATELET - Abnormal; Notable for the following components:   WBC 14.9 (*)    Hemoglobin 15.8 (*)    HCT 47.2 (*)    Neutro Abs 11.3 (*)    All other components within normal limits  I-STAT CG4 LACTIC ACID, ED  I-STAT BETA HCG BLOOD, ED ([facility], [facility], [facility] ONLY)  I-STAT CG4 LACTIC ACID, ED    EKG None  Radiology No results found.  Procedures Procedures (including critical care time)  Medications Ordered in ED Medications  clindamycin (CLEOCIN) capsule 300 mg (has no administration in time range)     Initial Impression / Assessment and Plan / ED Course  I have reviewed the triage vital signs and the nursing notes.  Pertinent labs & imaging results that were available during my care of the patient were reviewed by me and considered in my medical decision making (see chart for details).     3:58 PM Patient seen and examined. Medications ordered.   Vital signs reviewed and are as follows: BP (!) 139/102   Pulse (!) 116   Temp 99 F (37.2 C) (Oral)   Resp 18   SpO2 99%   Patient with no easy target for drainage at this time.  Offered dose of IV antibiotics versus oral antibiotics.  Patient states that she has been here since noon and would like to go home.  I will give her her first dose of oral clindamycin and discharge her to home with prescription for clindamycin and Vicodin.  Dental referrals given.  Patient counseled on use of narcotic pain medications. Counseled not to combine these medications with others containing tylenol. Urged not to drink alcohol, drive, or perform any other activities  that requires focus while taking these medications. The patient verbalizes understanding and agrees with the plan.   Final Clinical Impressions(s) / ED Diagnoses   Final diagnoses:  Dental abscess   Patient with facial swelling consistent with developing facial abscess.  No neck pain or swelling.  No trismus.  No fever. Exam unconcerning for Ludwig's angina or other deep tissue infection in neck.  Patient tolerating secretions.  Will discharge home with outpatient antibiotics and pain medication.  She otherwise appears well.  ED Discharge Orders         Ordered    clindamycin (CLEOCIN) 150 MG capsule  Every 6 hours     11/18/17 1547    HYDROcodone-acetaminophen (NORCO/VICODIN) 5-325 MG tablet  11/18/17 1547           Renne Crigler, PA-C 11/18/17 1559    Raeford Razor, MD 11/19/17 1531

## 2017-11-18 NOTE — ED Triage Notes (Signed)
Pt in c/o left lower jaw dental pain with swelling for the last two days, has a broken tooth but pain increased in the last two days

## 2017-11-18 NOTE — ED Notes (Signed)
No signature pad available. Pt ambulatory with no s/sx distress.

## 2017-11-18 NOTE — Discharge Instructions (Signed)
Please read and follow all provided instructions.  Your diagnoses today include:  1. Dental abscess    The exam and treatment you received today has been provided on an emergency basis only. This is not a substitute for complete medical or dental care.  Tests performed today include:  Vital signs. See below for your results today.   Medications prescribed:   Vicodin (hydrocodone/acetaminophen) - narcotic pain medication  DO NOT drive or perform any activities that require you to be awake and alert because this medicine can make you drowsy. BE VERY CAREFUL not to take multiple medicines containing Tylenol (also called acetaminophen). Doing so can lead to an overdose which can damage your liver and cause liver failure and possibly death.   Clindamycin - antibiotic  You have been prescribed an antibiotic medicine: take the entire course of medicine even if you are feeling better. Stopping early can cause the antibiotic not to work.  Take any prescribed medications only as directed.  Home care instructions:  Follow any educational materials contained in this packet.  Follow-up instructions: Please follow-up with your dentist for further evaluation of your symptoms.   Return instructions:   Please return to the Emergency Department if you experience worsening symptoms.  Please return if you develop a fever, you develop more swelling in your face or neck, you have trouble breathing or swallowing food.  Please return if you have any other emergent concerns.  Additional Information:  Your vital signs today were: BP (!) 139/102    Pulse (!) 116    Temp 99 F (37.2 C) (Oral)    Resp 18    SpO2 99%  If your blood pressure (BP) was elevated above 135/85 this visit, please have this repeated by your doctor within one month. --------------

## 2019-07-24 ENCOUNTER — Other Ambulatory Visit: Payer: Self-pay

## 2019-07-24 ENCOUNTER — Emergency Department (HOSPITAL_COMMUNITY)
Admission: EM | Admit: 2019-07-24 | Discharge: 2019-07-24 | Disposition: A | Discharge status: Home / Self Care | Payer: Self-pay | Attending: Emergency Medicine | Admitting: Emergency Medicine

## 2019-07-24 DIAGNOSIS — F17210 Nicotine dependence, cigarettes, uncomplicated: Secondary | ICD-10-CM | POA: Insufficient documentation

## 2019-07-24 DIAGNOSIS — T402X1A Poisoning by other opioids, accidental (unintentional), initial encounter: Secondary | ICD-10-CM | POA: Insufficient documentation

## 2019-07-24 NOTE — ED Notes (Signed)
Pt verbalizes understanding of DC instructions. Pt belongings returned and is ambulatory out of ED.    Pt provided with  Blue paper scrub top

## 2019-07-24 NOTE — ED Provider Notes (Signed)
[facility] DEPT Provider Note   CSN: [id number] Arrival date & time: 07/24/19  1925     History No chief complaint on file.   Marie Mckay is a 34 y.o. female.  Patient is a 35 year old female who presents after an overdose.  She has a history of chronic pain and says that she cannot afford to get into a pain clinic.  Occasionally she is able to take some opioid pain medication.  Tonight she snorted fentanyl.  She says that she has never done this before.  She was trying to relieve her pain.  She denies any suicidal ideation or thoughts of wanting to hurt herself.  She denies any other ingestions.  She says she has never done anything like that before.  She does not feel that she has an addiction to opioids and that she just needs them for pain control.  She was given Narcan by EMS and had some resultant vomiting.  She is noted to be tachycardic now but she says is because because she is humiliated and very anxious about what happened.        Past Medical History:  Diagnosis Date  . Dislocated elbow    Left     Patient Active Problem List   Diagnosis Date Noted  . Compression fracture of T12 vertebra w/ mild L1 anteriolisthesis 07/30/2013    No past surgical history on file.   OB History   No obstetric history on file.     No family history on file.  Social History   Tobacco Use  . Smoking status: Current Every Day Smoker    Packs/day: 1.00    Years: 10.00    Pack years: 10.00    Types: Cigarettes  . Smokeless tobacco: Never Used  Substance Use Topics  . Alcohol use: Yes    Comment: Twice a week.   . Drug use: No    Home Medications Prior to Admission medications   Medication Sig Start Date End Date Taking? Authorizing Provider  clindamycin (CLEOCIN) 150 MG capsule Take 2 capsules (300 mg total) by mouth every 6 (six) hours. Patient not taking: Reported on 07/24/2019 11/18/17   Carlisle Cater, PA-C    HYDROcodone-acetaminophen (NORCO/VICODIN) 5-325 MG tablet Take 1-2 tablets every 6 hours as needed for severe pain Patient not taking: Reported on 07/24/2019 11/18/17   Carlisle Cater, PA-C    Allergies    Patient has no known allergies.  Review of Systems   Review of Systems  Constitutional: Negative for chills, diaphoresis, fatigue and fever.  HENT: Negative for congestion, rhinorrhea and sneezing.   Eyes: Negative.   Respiratory: Negative for cough, chest tightness and shortness of breath.   Cardiovascular: Negative for chest pain and leg swelling.  Gastrointestinal: Negative for abdominal pain, blood in stool, diarrhea, nausea and vomiting.  Genitourinary: Negative for difficulty urinating, flank pain, frequency and hematuria.  Musculoskeletal: Negative for arthralgias and back pain.  Skin: Negative for rash.  Neurological: Negative for dizziness, speech difficulty, weakness, numbness and headaches.  Psychiatric/Behavioral: Negative for suicidal ideas. The patient is nervous/anxious.     Physical Exam Updated Vital Signs BP 119/79   Pulse 100   Temp 98.1 F (36.7 C) (Oral)   Resp 13   Ht 5\' 10"  (1.778 m)   Wt 99.8 kg   LMP 06/24/2019   SpO2 95%   BMI 31.57 kg/m   Physical Exam Constitutional:      Appearance: She is well-developed.  HENT:  Head: Normocephalic and atraumatic.  Eyes:     Pupils: Pupils are equal, round, and reactive to light.  Cardiovascular:     Rate and Rhythm: Regular rhythm. Tachycardia present.     Heart sounds: Normal heart sounds.  Pulmonary:     Effort: Pulmonary effort is normal. No respiratory distress.     Breath sounds: Normal breath sounds. No wheezing or rales.  Chest:     Chest wall: No tenderness.  Abdominal:     General: Bowel sounds are normal.     Palpations: Abdomen is soft.     Tenderness: There is no abdominal tenderness. There is no guarding or rebound.  Musculoskeletal:        General: Normal range of motion.      Cervical back: Normal range of motion and neck supple.  Lymphadenopathy:     Cervical: No cervical adenopathy.  Skin:    General: Skin is warm and dry.     Findings: No rash.  Neurological:     Mental Status: She is alert and oriented to person, place, and time.     ED Results / Procedures / Treatments   Labs (all labs ordered are listed, but only abnormal results are displayed) Labs Reviewed - No data to display  EKG None  Radiology No results found.  Procedures Procedures (including critical care time)  Medications Ordered in ED Medications - No data to display  ED Course  I have reviewed the triage vital signs and the nursing notes.  Pertinent labs & imaging results that were available during my care of the patient were reviewed by me and considered in my medical decision making (see chart for details).    MDM Rules/Calculators/A&P                      Patient is a 34 year old female who presents after opioid overdose.  She woke up with Narcan prior to arrival.  She is currently asymptomatic.  She was tachycardic on arrival but this has improved.  She does not want any further treatment here.  She is adamant about leaving.  She denies any intent for self-harm.  She does not want any consult to peers support.  She does not want any Narcan on discharge.  She says that this was a big mistake and it will not happen again.  She was given a Facilities manager for substance abuse.  Return precautions were given. Final Clinical Impression(s) / ED Diagnoses Final diagnoses:  Opioid overdose, accidental or unintentional, initial encounter [facility])    Rx / DC Orders ED Discharge Orders    None       Rolan Bucco, MD 07/24/19 2217

## 2019-07-24 NOTE — ED Notes (Signed)
Pt ambulatory to RR. Pt provided with urine cup.

## 2019-07-24 NOTE — ED Triage Notes (Signed)
Pt arrived via [facility] from public location at [facility] found unresponsive r/t snorting "fentynel but could have other substances"   2 mg Narcan internasal without response and 2mg  IV given in route with response and emesis   4mg  Zofran 22LAC

## 2019-07-24 NOTE — ED Notes (Signed)
PO challenge successful.

## 2019-07-24 NOTE — ED Notes (Signed)
Pt provided with phone and cup of water
